# Patient Record
Sex: Female | Born: 1970 | Race: White | Hispanic: Yes | Marital: Married | Smoking: Never smoker
Health system: Southern US, Community
[De-identification: ages and names within clinical notes are randomized; demographics above are authoritative.]

## PROBLEM LIST (undated history)

## (undated) ENCOUNTER — Inpatient Hospital Stay (HOSPITAL_COMMUNITY): Payer: Self-pay

## (undated) DIAGNOSIS — I8393 Asymptomatic varicose veins of bilateral lower extremities: Secondary | ICD-10-CM

## (undated) DIAGNOSIS — I10 Essential (primary) hypertension: Secondary | ICD-10-CM

## (undated) DIAGNOSIS — K8000 Calculus of gallbladder with acute cholecystitis without obstruction: Secondary | ICD-10-CM

## (undated) HISTORY — PX: ANKLE SURGERY: SHX546

## (undated) HISTORY — PX: ENDOSCOPIC VEIN LASER TREATMENT: SHX1508

## (undated) HISTORY — PX: DILATION AND CURETTAGE OF UTERUS: SHX78

---

## 1998-02-15 ENCOUNTER — Emergency Department (HOSPITAL_COMMUNITY): Admission: EM | Admit: 1998-02-15 | Discharge: 1998-02-15 | Payer: Self-pay | Admitting: Emergency Medicine

## 1998-02-15 ENCOUNTER — Encounter: Payer: Self-pay | Admitting: Emergency Medicine

## 1998-03-18 ENCOUNTER — Ambulatory Visit (HOSPITAL_COMMUNITY): Admission: RE | Admit: 1998-03-18 | Discharge: 1998-03-18 | Payer: Self-pay | Admitting: Obstetrics

## 1998-07-30 ENCOUNTER — Inpatient Hospital Stay (HOSPITAL_COMMUNITY): Admission: AD | Admit: 1998-07-30 | Discharge: 1998-08-02 | Payer: Self-pay | Admitting: *Deleted

## 1998-08-12 ENCOUNTER — Emergency Department (HOSPITAL_COMMUNITY): Admission: EM | Admit: 1998-08-12 | Discharge: 1998-08-12 | Payer: Self-pay | Admitting: Emergency Medicine

## 1998-08-12 ENCOUNTER — Encounter: Payer: Self-pay | Admitting: Emergency Medicine

## 1998-08-18 ENCOUNTER — Emergency Department (HOSPITAL_COMMUNITY): Admission: EM | Admit: 1998-08-18 | Discharge: 1998-08-18 | Payer: Self-pay | Admitting: *Deleted

## 2001-05-27 ENCOUNTER — Encounter: Payer: Self-pay | Admitting: Family Medicine

## 2001-05-27 ENCOUNTER — Ambulatory Visit (HOSPITAL_COMMUNITY): Admission: RE | Admit: 2001-05-27 | Discharge: 2001-05-27 | Payer: Self-pay | Admitting: Family Medicine

## 2003-04-25 ENCOUNTER — Ambulatory Visit: Admission: RE | Admit: 2003-04-25 | Discharge: 2003-04-25 | Payer: Self-pay | Admitting: Obstetrics and Gynecology

## 2003-04-26 ENCOUNTER — Ambulatory Visit (HOSPITAL_COMMUNITY): Admission: RE | Admit: 2003-04-26 | Discharge: 2003-04-26 | Payer: Self-pay | Admitting: *Deleted

## 2003-06-25 ENCOUNTER — Inpatient Hospital Stay (HOSPITAL_COMMUNITY): Admission: AD | Admit: 2003-06-25 | Discharge: 2003-06-27 | Payer: Self-pay | Admitting: *Deleted

## 2004-04-09 ENCOUNTER — Ambulatory Visit: Payer: Self-pay | Admitting: Nurse Practitioner

## 2004-04-22 ENCOUNTER — Ambulatory Visit: Payer: Self-pay | Admitting: Nurse Practitioner

## 2004-04-22 ENCOUNTER — Ambulatory Visit: Payer: Self-pay | Admitting: *Deleted

## 2004-06-20 ENCOUNTER — Ambulatory Visit: Payer: Self-pay | Admitting: Nurse Practitioner

## 2004-07-22 ENCOUNTER — Ambulatory Visit: Payer: Self-pay | Admitting: Nurse Practitioner

## 2005-03-23 ENCOUNTER — Ambulatory Visit: Payer: Self-pay | Admitting: Nurse Practitioner

## 2006-06-24 ENCOUNTER — Ambulatory Visit: Payer: Self-pay | Admitting: Internal Medicine

## 2006-06-24 ENCOUNTER — Emergency Department (HOSPITAL_COMMUNITY): Admission: EM | Admit: 2006-06-24 | Discharge: 2006-06-24 | Payer: Self-pay | Admitting: Emergency Medicine

## 2006-06-24 ENCOUNTER — Ambulatory Visit (HOSPITAL_COMMUNITY): Admission: RE | Admit: 2006-06-24 | Discharge: 2006-06-24 | Payer: Self-pay | Admitting: Nurse Practitioner

## 2006-06-29 ENCOUNTER — Ambulatory Visit (HOSPITAL_COMMUNITY): Admission: RE | Admit: 2006-06-29 | Discharge: 2006-06-29 | Payer: Self-pay | Admitting: Orthopedic Surgery

## 2007-01-19 ENCOUNTER — Encounter (INDEPENDENT_AMBULATORY_CARE_PROVIDER_SITE_OTHER): Payer: Self-pay | Admitting: *Deleted

## 2007-11-09 ENCOUNTER — Encounter (INDEPENDENT_AMBULATORY_CARE_PROVIDER_SITE_OTHER): Payer: Self-pay | Admitting: Internal Medicine

## 2007-11-09 LAB — CONVERTED CEMR LAB
ALT: 15 units/L (ref 0–35)
AST: 23 units/L (ref 0–37)
BUN: 9 mg/dL (ref 6–23)
CO2: 23 meq/L (ref 19–32)
Calcium: 9.6 mg/dL (ref 8.4–10.5)
Chloride: 107 meq/L (ref 96–112)
Creatinine, Ser: 0.54 mg/dL (ref 0.40–1.20)
Glucose, Bld: 91 mg/dL (ref 70–99)
HDL: 57 mg/dL (ref >39)
TSH: 1.168 microintl units/mL (ref 0.350–4.50)
Total Bilirubin: 0.6 mg/dL (ref 0.3–1.2)
Total CHOL/HDL Ratio: 3.5
Triglycerides: 142 mg/dL (ref <150)

## 2008-08-22 ENCOUNTER — Ambulatory Visit: Payer: Self-pay | Admitting: Internal Medicine

## 2008-09-06 ENCOUNTER — Ambulatory Visit: Payer: Self-pay | Admitting: Internal Medicine

## 2009-02-19 ENCOUNTER — Ambulatory Visit: Payer: Self-pay | Admitting: Internal Medicine

## 2009-10-04 ENCOUNTER — Ambulatory Visit: Payer: Self-pay | Admitting: Family Medicine

## 2009-10-08 ENCOUNTER — Ambulatory Visit: Payer: Self-pay | Admitting: Internal Medicine

## 2009-11-29 ENCOUNTER — Ambulatory Visit: Payer: Self-pay | Admitting: Internal Medicine

## 2010-01-07 ENCOUNTER — Ambulatory Visit: Payer: Self-pay | Admitting: Internal Medicine

## 2010-01-07 LAB — CONVERTED CEMR LAB
Chlamydia, DNA Probe: NEGATIVE
Pap Smear: NEGATIVE

## 2010-06-13 ENCOUNTER — Inpatient Hospital Stay (INDEPENDENT_AMBULATORY_CARE_PROVIDER_SITE_OTHER)
Admission: RE | Admit: 2010-06-13 | Discharge: 2010-06-13 | Disposition: A | Discharge status: Home / Self Care | Payer: Self-pay | Source: Ambulatory Visit | Attending: Family Medicine | Admitting: Family Medicine

## 2010-06-13 DIAGNOSIS — I8390 Asymptomatic varicose veins of unspecified lower extremity: Secondary | ICD-10-CM

## 2010-09-19 NOTE — Op Note (Signed)
[name], Weiss            ACCOUNT NO.:  [id number]   MEDICAL RECORD NO.:  [id number]          PATIENT TYPE:  AMB   LOCATION:  DAY                          FACILITY:  [facility]   PHYSICIAN:  Vania Rea. Supple, M.D.  DATE OF BIRTH:  1971-03-12   DATE OF PROCEDURE:  06/29/2006  DATE OF DISCHARGE:                               OPERATIVE REPORT   PREOPERATIVE DIAGNOSIS:  Displaced left bimalleolar ankle fracture.   POSTOPERATIVE DIAGNOSIS:  Displaced left bimalleolar ankle fracture.   PROCEDURE:  Open reduction/internal fixation of displaced left  bimalleolar ankle fracture.   SURGEON:  Vania Rea. Supple, M.D.   Threasa HeadsFrench Ana A. Shuford, P.A.-C.   ANESTHESIA:  General endotracheal.  In addition, at the end of the case,  we applied local anesthetic, 0.5% Marcaine with epinephrine along the  incision edges.   TOURNIQUET TIME:  Less than two hours.   ESTIMATED BLOOD LOSS:  100 cc.   DRAINS:  None.   HISTORY:  [name] is a 40 year old female who approximately eight  days ago slipped and fell, injuring her left ankle.  She sought medical  help at a local emergency room approximately four days after the  original injury, where x-rays were obtained.  She had a bimalleolar  ankle fracture.  She was placed in a splint and was followed up in my  office yesterday, at which time on evaluation, she was found to have the  displaced left ankle fracture but well padded and splinted.  Radiographs  were reviewed.  Due to the degree of displacement, she is brought to the  operating room at this time for planned left ankle ORIF, as described  below.   I preoperatively counseled [name] and her husband on treatment  options as well as risks versus the benefits thereof.  Possible surgical  complications of bleeding, infection, neurovascular injury, malunion,  nonunion, post-traumatic arthritis, ongoing pain and swelling, and  possible need for additional surgery were reviewed.   They understand and  accept and agree with our planned procedure.   PROCEDURE IN DETAIL:  After undergoing routine preoperative evaluation,  the patient received prophylactic antibiotics.  Placed supine on the  operating table and underwent smooth induction of general endotracheal  anesthesia.  Did receive prophylactic antibiotics.  The tourniquet was  applied to the left thigh, and the left leg was then sterilely prepped  and draped in a standard fashion.  The leg was exsanguinated with the  tourniquet inflated to 350 mmHg.  Began with an anterior hockey stick  incision about the medial malleolus with skin flaps elevated and  incision length totally approximately 6 cm.  Dissection carried deeply  where the saphenous vein was carefully identified and protected.  Dissection deeply to the fracture site, which was exposed with  subperiosteal elevation.  Interpose soft tissue was removed from the  fracture site as well as soft tissue and clot within the ankle joint.  Inspected the talar dome, which showed no obvious defects on the area  that was visible with this limited view.  We copiously irrigated the  ankle joint.  Attention was  then turned laterally, where a longitudinal  12 cm incision was made over the distal fibula.  Sharp dissection  carried down to the skin and subcutaneous tissues, and deep fascia  divided in line with the skin incision with the peroneal musculature and  tendons reflected posteriorly and subperiosteal elevation used to expose  the lateral and posterior cortex of the distal fibula, centered about  the fracture site, and proximally and distally from this area.  The  fracture was then exposed, and interpose soft tissue and blood clot was  meticulously removed.  Reduction was then achieved utilizing a bone-  holding clamp, then we contoured a seven-hole 1/3 tibial plate to fit  over the posterolateral cortex of the distal fibula.  This was then  transfixed in a  standard AO technique with 3.5 cortical screws  proximally, 4.0 cancellous screws distally, and a lag screw across the  fracture site to allow compression.  Fluoroscopic images were then  obtained, which confirmed good alignment of the fracture site and good  position of the hardware.  Our attention was then directed laterally,  where under direct visualization, the medial malleolus was directly  reduced, and then two guidepin's for the 4.0 cannulated screws were then  passed across the medial malleolar fracture site.  Fluoroscopic images  were used to confirm proper positioning of the hardware.  We then over-  drilled these and placed two 44 mm 4.0 cancellous screws with washers,  obtaining good bony purchase.  Excellent compression across the fracture  site was achieved, and then fluoroscopic images were then used to  confirm good alignment of the fracture site and good position of the  hardware.  The wounds were all then copiously irrigated and closed in  layers with 0 Vicryl over the deep fascia, laterally 2-0 Vicryl to the  subcu medially and laterally, and intracuticular 3-0 Monocryl for the  skin laterally, and Steri-Strips applied over medial and lateral  incisions.  A combination of Marcaine with epinephrine was instilled  along the skin edges, both medially and laterally.  A bulky, well-  padded, short leg plaster splint was applied with the ankle in the  neutral position.  The tourniquet was then let down.  The patient was  extubated and taken to the recovery room in stable condition.      Vania Rea. Supple, M.D.  Electronically Signed     KMS/MEDQ  D:  06/29/2006  T:  06/29/2006  Job:  [id number]

## 2011-08-10 ENCOUNTER — Other Ambulatory Visit: Payer: Self-pay

## 2011-09-23 ENCOUNTER — Ambulatory Visit
Admission: RE | Admit: 2011-09-23 | Discharge: 2011-09-23 | Disposition: A | Discharge status: Home / Self Care | Payer: No Typology Code available for payment source | Source: Ambulatory Visit | Attending: Specialist | Admitting: Specialist

## 2011-09-23 ENCOUNTER — Other Ambulatory Visit: Payer: Self-pay | Admitting: Specialist

## 2011-09-23 DIAGNOSIS — R609 Edema, unspecified: Secondary | ICD-10-CM

## 2011-09-23 DIAGNOSIS — R52 Pain, unspecified: Secondary | ICD-10-CM

## 2011-10-21 ENCOUNTER — Encounter (HOSPITAL_COMMUNITY): Payer: Self-pay

## 2011-10-21 ENCOUNTER — Emergency Department (INDEPENDENT_AMBULATORY_CARE_PROVIDER_SITE_OTHER): Payer: Self-pay

## 2011-10-21 ENCOUNTER — Emergency Department (HOSPITAL_COMMUNITY)
Admission: EM | Admit: 2011-10-21 | Discharge: 2011-10-21 | Disposition: A | Discharge status: Home / Self Care | Payer: Self-pay | Source: Home / Self Care | Attending: Emergency Medicine | Admitting: Emergency Medicine

## 2011-10-21 DIAGNOSIS — L97309 Non-pressure chronic ulcer of unspecified ankle with unspecified severity: Secondary | ICD-10-CM

## 2011-10-21 HISTORY — DX: Essential (primary) hypertension: I10

## 2011-10-21 MED ORDER — TRAMADOL HCL 50 MG PO TABS: 50.0000 mg | ORAL_TABLET | Freq: Four times a day (QID) | ORAL | Status: AC | PRN

## 2011-10-21 MED ORDER — AMOXICILLIN-POT CLAVULANATE 500-125 MG PO TABS: 1.0000 | ORAL_TABLET | Freq: Two times a day (BID) | ORAL | Status: AC

## 2011-10-21 NOTE — ED Provider Notes (Addendum)
History     CSN: [id number]  Arrival date & time 10/21/11  1224   First MD Initiated Contact with Patient 10/21/11 1423      Chief Complaint  Patient presents with  . Ankle Pain    (Consider location/radiation/quality/duration/timing/severity/associated sxs/prior treatment) HPI Comments:   Patient persists urgent care this afternoon complaining of left ankle swelling and drainage that started yesterday. She describes that for about 2 weeks she bled some from the left lateral aspect of her left ankle and then she developed a small ulcer in that area where she had her surgical scar from an old ankle surgery repair about 5 years ago. She does not remember any recent injury or direct trauma to the area. Patient denies any numbness, tingling or weakness of her left foot. Patient also denies any constitutional symptoms such as changes in appetite, fevers, generalized malaise, Oddi aches.  Patient is a 41 y.o. female presenting with ankle pain. The history is provided by the patient.  Ankle Pain  The incident occurred at home. The pain is moderate. The pain has been constant since onset. Pertinent negatives include no numbness, no inability to bear weight, no loss of motion, no muscle weakness, no loss of sensation and no tingling. She reports no foreign bodies present. The symptoms are aggravated by activity, bearing weight and palpation. Treatments tried: GENTIAN OF VIOLET. The treatment provided no relief.    Past Medical History  Diagnosis Date  . Hypertension     Past Surgical History  Procedure Date  . Ankle surgery     No family history on file.  History  Substance Use Topics  . Smoking status: Never Smoker   . Smokeless tobacco: Not on file  . Alcohol Use: No    OB History    Grav Para Term Preterm Abortions TAB SAB Ect Mult Living                  Review of Systems  Constitutional: Negative for fever, chills, activity change, appetite change and fatigue.    Musculoskeletal: Positive for joint swelling. Negative for back pain and arthralgias.  Skin: Positive for wound.  Neurological: Negative for tingling, facial asymmetry, weakness and numbness.    Allergies  Review of patient's allergies indicates no known allergies.  Home Medications   Current Outpatient Rx  Name Route Sig Dispense Refill  . PRESCRIPTION MEDICATION  BP medication    . AMOXICILLIN-POT CLAVULANATE 500-125 MG PO TABS Oral Take 1 tablet (500 mg total) by mouth 2 (two) times daily. 20 tablet 0  . TRAMADOL HCL 50 MG PO TABS Oral Take 1 tablet (50 mg total) by mouth every 6 (six) hours as needed for pain. 15 tablet 0    BP 138/91  Pulse 72  Temp 98 F (36.7 C) (Oral)  Resp 20  SpO2 99%  LMP 10/15/2011  Physical Exam  Nursing note and vitals reviewed. Constitutional: She appears well-developed and well-nourished.  Non-toxic appearance. She does not have a sickly appearance. She does not appear ill. No distress.  Musculoskeletal: She exhibits tenderness. She exhibits no edema.       Feet:  Neurological: She is alert.  Skin: No erythema.    ED Course  Procedures (including critical care time)  Labs Reviewed - No data to display Dg Ankle Complete Left  10/21/2011  *RADIOLOGY REPORT*  Clinical Data: Left ankle swelling, bruising and lateral wound to drainage.  Open, black wound laterally.  Status post hardware fixation of the  ankle on 06/29/2006.  LEFT ANKLE COMPLETE - 3+ VIEW  Comparison: 06/29/2080 and 06/24/2006.  Findings: Screw fixation of the medial malleolus and screw and plate fixation of the lateral malleolus.  Diffuse soft tissue swelling.  Small area of gas/air in the superficial soft tissues, laterally.  No bone destruction or periosteal reaction.  No effusion seen.  Mild talotibial degenerative changes and mild posterior calcaneal spur formation.  IMPRESSION:  1.  Diffuse soft tissue swelling with a small left lateral ulcer. 2.  No plain radiographic  evidence of osteomyelitis.  Original Report Authenticated By: Darrol Angel, M.D.     1. Ulcer of ankle    Have decided to treat patient for cellulitis, or sure to start with antibiotic and followup next week with primary care Dr. Marta Antu the initial origin of this ulceration was possibly a superficial varices did ruptured and generated area discontinued epidermis and ulceration.    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 Had a small ulceration with associated soft tissue swelling and minimal discrete clear discharge from any ulcerative lesion on the lateral malleolus of her L ankle. Suspect patient has a localized infection with a mild soft tissue swelling. Does not feel particularly warmth and discharges a clear exudate, on exam was not revealing for are suggestive of subcutaneous crepitus to suggest air suspect imaging related to area where small ulcer (1.8 cm) 10 continuity with  environment. And understands to followup with her primary care Dr. if one not healing properly to be referred to the care Center. He understood treatment plan and symptoms that would warrant further evaluation in the emergency department.       Jimmie Molly, MD 10/21/11 1610  Jimmie Molly, MD 10/21/11 [phone]

## 2011-10-21 NOTE — ED Notes (Signed)
Pt c/o open area to lateral aspect of lt ankle for 2 weeks.  States it started draining white drainage today.  States she has been applying gentian violet to the area.  Pt had surgery with hardware placed approx. 5 years ago.  No new injury.

## 2011-10-21 NOTE — ED Notes (Signed)
Speaks Spanish- Nando EMT translating per pt request.

## 2011-10-21 NOTE — Discharge Instructions (Signed)
  Sus piernas demuestran evidencia- de algunas deficiencias venosas, con varices y telangiectasias- No utilize mas la violeta de genciana- tome este antibiotico, y vea a su doctor la semana proxima para asegurarnos que esta lesion empieze a curarse, o establecer si necesitara un referido para el wound center [employer] explique   lceras en la piel  (Skin Ulcer) Rolan Lipa en la piel es una llaga abierta que puede ser superficial o profunda. En algunos casos se infectan y son difciles de tratar. Puede pasar 1 mes o ms antes que se produzca un progreso en la curacin.  CAUSAS   Traumatismos.   Problemas en las venas o en las arterias.   Diabetes.   [place] de insectos.   Escaras.   Problemas inflamatorios.  SNTOMAS   Dolor, irritacin, hinchazn y sensibilidad alrededor de Engineer, site.   Grant Ruts.   Sangrado que proviene de la lcera.   Un liquido amarillo o claro que sale de Engineer, site.  DIAGNSTICO  Hay diferentes tipos de lceras de la piel. Teresita Madura herida abierta deber ser examinada. Para determinar el tipo de lcera podrn indicarle algunas pruebas. El tratamiento adecuado depende del tipo de Occupational hygienist.  TRATAMIENTO El tratamiento es un desafo a Air cabin crew. Puede incluir:   El uso de una venda Adult nurse, medias de compresin, o un molde de gel sobre el rea de la Occupational hygienist.   Tomar antibiticos o aplicar cremas antibiticas en la zona afectada, si hay infeccin.  INSTRUCCIONES PARA EL CUIDADO EN EL HOGAR   Colquese las curacionesvendajes, envolturas, o moldes segn lo indique su mdico.   Cambie el vendaje en la forma aconsejada por el mdico.   Tome todos los medicamentos segn le indic su mdico.   Mantenga la zona limpia y [place].   Evitar golpearse en la zona afectada.   Consuma una dieta balanceada y saludable que incluya muchas frutas y verduras.   Si fuma, considere dejar de fumar o reducir la cantidad de cigarrillos.   Una vez que la lcera sana, puede hacer  ejercicio con regularidad segn las indicaciones de su mdico.   Haga lo que le indica el mdico para asegurarse de que su presin arterial, el colesterol y la diabetes estn bien controladas.   Mantenga la piel humectada. La piel seca puede agrietarse y causar lceras.  SOLICITE ATENCIN MDICA DE INMEDIATO SI:  El dolor empeora.   Tiene hinchazn, enrojecimiento o lquido en la zona de la lcera.   Siente escalofros.   Tiene fiebre.  ASEGRESE DE QUE:   Comprende estas instrucciones.   Controlar su enfermedad.   Solicitar ayuda de inmediato si no mejora o si empeora.  Document Released: 04/20/2005 Document Revised: 04/09/2011 [facility] Patient Information 2012 [place], [place].

## 2012-06-21 ENCOUNTER — Observation Stay (HOSPITAL_COMMUNITY)
Admission: EM | Admit: 2012-06-21 | Discharge: 2012-06-23 | Disposition: A | Discharge status: Home / Self Care | Payer: Self-pay | Attending: Surgery | Admitting: Surgery

## 2012-06-21 ENCOUNTER — Encounter (HOSPITAL_COMMUNITY): Payer: Self-pay | Admitting: Emergency Medicine

## 2012-06-21 ENCOUNTER — Emergency Department (HOSPITAL_COMMUNITY): Payer: Self-pay

## 2012-06-21 DIAGNOSIS — K8000 Calculus of gallbladder with acute cholecystitis without obstruction: Secondary | ICD-10-CM

## 2012-06-21 DIAGNOSIS — K810 Acute cholecystitis: Secondary | ICD-10-CM

## 2012-06-21 DIAGNOSIS — I10 Essential (primary) hypertension: Secondary | ICD-10-CM | POA: Insufficient documentation

## 2012-06-21 DIAGNOSIS — N390 Urinary tract infection, site not specified: Secondary | ICD-10-CM

## 2012-06-21 DIAGNOSIS — R1011 Right upper quadrant pain: Secondary | ICD-10-CM

## 2012-06-21 DIAGNOSIS — K8010 Calculus of gallbladder with chronic cholecystitis without obstruction: Secondary | ICD-10-CM

## 2012-06-21 HISTORY — DX: Calculus of gallbladder with acute cholecystitis without obstruction: K80.00

## 2012-06-21 LAB — CBC WITH DIFFERENTIAL/PLATELET
HCT: 37.5 % (ref 36.0–46.0)
MCH: 27.8 pg (ref 26.0–34.0)
Monocytes Relative: 6 % (ref 3–12)
Neutro Abs: 7.3 10*3/uL (ref 1.7–7.7)
Neutrophils Relative %: 74 % (ref 43–77)
Platelets: 209 10*3/uL (ref 150–400)

## 2012-06-21 LAB — URINE MICROSCOPIC-ADD ON

## 2012-06-21 LAB — URINALYSIS, ROUTINE W REFLEX MICROSCOPIC
Bilirubin Urine: NEGATIVE
Hgb urine dipstick: NEGATIVE
Ketones, ur: 15 mg/dL — AB
Protein, ur: 30 mg/dL — AB
Specific Gravity, Urine: 1.035 — ABNORMAL HIGH (ref 1.005–1.030)

## 2012-06-21 LAB — COMPREHENSIVE METABOLIC PANEL
ALT: 55 U/L — ABNORMAL HIGH (ref 0–35)
AST: 39 U/L — ABNORMAL HIGH (ref 0–37)
Alkaline Phosphatase: 62 U/L (ref 39–117)
Calcium: 9.2 mg/dL (ref 8.4–10.5)
Glucose, Bld: 97 mg/dL (ref 70–99)
Potassium: 3.7 mEq/L (ref 3.5–5.1)
Sodium: 138 mEq/L (ref 135–145)
Total Protein: 7.9 g/dL (ref 6.0–8.3)

## 2012-06-21 MED ORDER — KCL IN DEXTROSE-NACL 20-5-0.9 MEQ/L-%-% IV SOLN
INTRAVENOUS | Status: DC
  Administered 2012-06-21 – 2012-06-23 (×4): via INTRAVENOUS
  Filled 2012-06-21 (×5): qty 1000

## 2012-06-21 MED ORDER — SODIUM CHLORIDE 0.9 % IV SOLN
Freq: Once | INTRAVENOUS | Status: AC
  Administered 2012-06-21: 19:00:00 via INTRAVENOUS

## 2012-06-21 MED ORDER — ENOXAPARIN SODIUM 40 MG/0.4ML ~~LOC~~ SOLN
40.0000 mg | SUBCUTANEOUS | Status: DC
  Administered 2012-06-23: 40 mg via SUBCUTANEOUS
  Filled 2012-06-21 (×2): qty 0.4

## 2012-06-21 MED ORDER — ONDANSETRON HCL 4 MG/2ML IJ SOLN: 4.0000 mg | Freq: Four times a day (QID) | INTRAMUSCULAR | Status: DC | PRN

## 2012-06-21 MED ORDER — CIPROFLOXACIN IN D5W 400 MG/200ML IV SOLN
400.0000 mg | Freq: Two times a day (BID) | INTRAVENOUS | Status: DC
  Administered 2012-06-21 – 2012-06-23 (×4): 400 mg via INTRAVENOUS
  Filled 2012-06-21 (×5): qty 200

## 2012-06-21 MED ORDER — ONDANSETRON HCL 4 MG/2ML IJ SOLN
4.0000 mg | Freq: Once | INTRAMUSCULAR | Status: AC
  Administered 2012-06-21: 4 mg via INTRAVENOUS
  Filled 2012-06-21: qty 2

## 2012-06-21 MED ORDER — HYDROMORPHONE HCL PF 1 MG/ML IJ SOLN
1.0000 mg | Freq: Once | INTRAMUSCULAR | Status: AC
  Administered 2012-06-21: 1 mg via INTRAVENOUS
  Filled 2012-06-21: qty 1

## 2012-06-21 MED ORDER — DEXTROSE 5 % IV SOLN
1.0000 g | Freq: Once | INTRAVENOUS | Status: AC
  Administered 2012-06-21: 1 g via INTRAVENOUS
  Filled 2012-06-21: qty 10

## 2012-06-21 MED ORDER — HYDROMORPHONE HCL PF 1 MG/ML IJ SOLN
1.0000 mg | INTRAMUSCULAR | Status: DC | PRN
  Administered 2012-06-21 – 2012-06-23 (×4): 1 mg via INTRAVENOUS
  Filled 2012-06-21 (×4): qty 1

## 2012-06-21 NOTE — ED Notes (Signed)
Floor called to give report, Dois Davenport RN states to be called back in minutes.

## 2012-06-21 NOTE — ED Notes (Signed)
Pt c/o upper abd pain radiating around to back, onset 2:00am.  Also c/o nausea with vomiting.

## 2012-06-21 NOTE — ED Provider Notes (Signed)
History     CSN: [id number]  Arrival date & time 06/21/12  1540   First MD Initiated Contact with Patient 06/21/12 1748      Chief Complaint  Patient presents with  . Abdominal Pain    (Consider location/radiation/quality/duration/timing/severity/associated sxs/prior treatment) HPI The patient presents with concerns of abdominal pain, nausea, vomiting, diaphoresis. She states that she has had similar episodes in the recent past, though nothing as severe or as prolonged as this episode. Today, approximately 14 hours prior to evaluation the patient developed right upper quadrant, epigastric pain.  Since that time there's been sharp pain in that area.  There is radiation to the right flank.  There is associated nausea, vomiting.  There is no diarrhea or vaginal bleeding/discharge.   The patient is also anorexic.  No history of abdominal surgery.  No relief with anything.  No clear exacerbating factor Past Medical History  Diagnosis Date  . Hypertension     Past Surgical History  Procedure Laterality Date  . Ankle surgery      No family history on file.  History  Substance Use Topics  . Smoking status: Never Smoker   . Smokeless tobacco: Not on file  . Alcohol Use: No    OB History   Grav Para Term Preterm Abortions TAB SAB Ect Mult Living                  Review of Systems  Constitutional:       Per HPI, otherwise negative  HENT:       Per HPI, otherwise negative  Respiratory:       Per HPI, otherwise negative  Cardiovascular:       Per HPI, otherwise negative  Gastrointestinal: Negative for vomiting.  Endocrine:       Negative aside from HPI  Genitourinary:       Neg aside from HPI   Musculoskeletal:       Per HPI, otherwise negative  Skin: Negative.   Neurological: Negative for syncope.    Allergies  Review of patient's allergies indicates no known allergies.  Home Medications   Current Outpatient Rx  Name  Route  Sig  Dispense  Refill  .  Aspirin Effervescent (ALKA-SELTZER PO)   Oral   Take 2 tablets by mouth daily as needed. For upset stomach         . hydrochlorothiazide (HYDRODIURIL) 25 MG tablet   Oral   Take 12.5 mg by mouth daily.          Marland Kitchen ibuprofen (ADVIL,MOTRIN) 200 MG tablet   Oral   Take 400 mg by mouth daily as needed for pain.           BP 141/84  Pulse 76  Temp(Src) 98.1 F (36.7 C) (Oral)  Resp 16  SpO2 97%  LMP 05/25/2012  Physical Exam  Nursing note and vitals reviewed. Constitutional: She is oriented to person, place, and time. She appears well-developed and well-nourished. No distress.  HENT:  Head: Normocephalic and atraumatic.  Eyes: Conjunctivae and EOM are normal.  Cardiovascular: Normal rate and regular rhythm.   Pulmonary/Chest: Effort normal and breath sounds normal. No stridor. No respiratory distress.  Abdominal: She exhibits no distension. There is tenderness in the right upper quadrant and epigastric area. There is guarding and CVA tenderness. There is no rigidity.  Musculoskeletal: She exhibits no edema.  Neurological: She is alert and oriented to person, place, and time. No cranial nerve deficit.  Skin: Skin is  warm and dry.  Psychiatric: She has a normal mood and affect.    ED Course  Procedures (including critical care time)  Labs Reviewed  URINALYSIS, ROUTINE W REFLEX MICROSCOPIC - Abnormal; Notable for the following:    Color, Urine AMBER (*)    APPearance CLOUDY (*)    Specific Gravity, Urine 1.035 (*)    Ketones, ur 15 (*)    Protein, ur 30 (*)    Nitrite POSITIVE (*)    Leukocytes, UA MODERATE (*)    All other components within normal limits  COMPREHENSIVE METABOLIC PANEL - Abnormal; Notable for the following:    Creatinine, Ser 0.39 (*)    AST 39 (*)    ALT 55 (*)    All other components within normal limits  URINE MICROSCOPIC-ADD ON - Abnormal; Notable for the following:    Squamous Epithelial / LPF MANY (*)    Bacteria, UA MANY (*)    All other  components within normal limits  URINE CULTURE  CBC WITH DIFFERENTIAL  POCT PREGNANCY, URINE   No results found.   No diagnosis found.  I reviewed the initial labs with the patient and her husband.  Though the presentation is most consistent, Fridays, given the patient's positive Murphy's sign, she will have ultrasound.  Patient seen and evaluated by Dr. Luisa Hart as well.  MDM  This previously well F now p/w RUQ and R flank pain.  W/U c/w both pyelo and cholecystitis, via [place] and labs.  Pain well controlled in ED, and the patient received initial ABX. Patient admitted for further E/M.        Gerhard Munch, MD 06/21/12 2209

## 2012-06-21 NOTE — ED Notes (Addendum)
Patient transported to US 

## 2012-06-21 NOTE — H&P (Signed)
Christina Weiss is an 42 y.o. female.   Chief Complaint: RUQ abdominal pain HPI: 1 day hx of N/V/RUQ abdominal pain severe and constant.  No flank pain.    Past Medical History  Diagnosis Date  . Hypertension     Past Surgical History  Procedure Laterality Date  . Ankle surgery      No family history on file. Social History:  reports that she has never smoked. She does not have any smokeless tobacco history on file. She reports that she does not drink alcohol or use illicit drugs.  Allergies: No Known Allergies   (Not in a hospital admission)  Results for orders placed during the hospital encounter of 06/21/12 (from the past 48 hour(s))  CBC WITH DIFFERENTIAL     Status: None   Collection Time    06/21/12  4:07 PM      Result Value Range   WBC 9.9  4.0 - 10.5 K/uL   RBC 4.49  3.87 - 5.11 MIL/uL   Hemoglobin 12.5  12.0 - 15.0 g/dL   HCT 40.9  81.1 - 91.4 %   MCV 83.5  78.0 - 100.0 fL   MCH 27.8  26.0 - 34.0 pg   MCHC 33.3  30.0 - 36.0 g/dL   RDW 78.2  95.6 - 21.3 %   Platelets 209  150 - 400 K/uL   Neutrophils Relative 74  43 - 77 %   Neutro Abs 7.3  1.7 - 7.7 K/uL   Lymphocytes Relative 19  12 - 46 %   Lymphs Abs 1.8  0.7 - 4.0 K/uL   Monocytes Relative 6  3 - 12 %   Monocytes Absolute 0.5  0.1 - 1.0 K/uL   Eosinophils Relative 2  0 - 5 %   Eosinophils Absolute 0.2  0.0 - 0.7 K/uL   Basophils Relative 0  0 - 1 %   Basophils Absolute 0.0  0.0 - 0.1 K/uL  COMPREHENSIVE METABOLIC PANEL     Status: Abnormal   Collection Time    06/21/12  4:07 PM      Result Value Range   Sodium 138  135 - 145 mEq/L   Potassium 3.7  3.5 - 5.1 mEq/L   Chloride 99  96 - 112 mEq/L   CO2 28  19 - 32 mEq/L   Glucose, Bld 97  70 - 99 mg/dL   BUN 8  6 - 23 mg/dL   Creatinine, Ser 0.86 (*) 0.50 - 1.10 mg/dL   Calcium 9.2  8.4 - 57.8 mg/dL   Total Protein 7.9  6.0 - 8.3 g/dL   Albumin 4.2  3.5 - 5.2 g/dL   AST 39 (*) 0 - 37 U/L   ALT 55 (*) 0 - 35 U/L   Alkaline Phosphatase 62  39 -  117 U/L   Total Bilirubin 0.4  0.3 - 1.2 mg/dL   GFR calc non Af Amer >90  >90 mL/min   GFR calc Af Amer >90  >90 mL/min   Comment:            The eGFR has been calculated     using the CKD EPI equation.     This calculation has not been     validated in all clinical     situations.     eGFR's persistently     <90 mL/min signify     possible Chronic Kidney Disease.  URINALYSIS, ROUTINE W REFLEX MICROSCOPIC     Status:  Abnormal   Collection Time    06/21/12  4:19 PM      Result Value Range   Color, Urine AMBER (*) YELLOW   Comment: BIOCHEMICALS MAY BE AFFECTED BY COLOR   APPearance CLOUDY (*) CLEAR   Specific Gravity, Urine 1.035 (*) 1.005 - 1.030   pH 6.5  5.0 - 8.0   Glucose, UA NEGATIVE  NEGATIVE mg/dL   Hgb urine dipstick NEGATIVE  NEGATIVE   Bilirubin Urine NEGATIVE  NEGATIVE   Ketones, ur 15 (*) NEGATIVE mg/dL   Protein, ur 30 (*) NEGATIVE mg/dL   Urobilinogen, UA 0.2  0.0 - 1.0 mg/dL   Nitrite POSITIVE (*) NEGATIVE   Leukocytes, UA MODERATE (*) NEGATIVE  URINE MICROSCOPIC-ADD ON     Status: Abnormal   Collection Time    06/21/12  4:19 PM      Result Value Range   Squamous Epithelial / LPF MANY (*) RARE   WBC, UA 7-10  <3 WBC/hpf   RBC / HPF 0-2  <3 RBC/hpf   Bacteria, UA MANY (*) RARE   Urine-Other MUCOUS PRESENT    POCT PREGNANCY, URINE     Status: None   Collection Time    06/21/12  4:25 PM      Result Value Range   Preg Test, Ur NEGATIVE  NEGATIVE   Comment:            THE SENSITIVITY OF THIS     METHODOLOGY IS >24 mIU/mL   US Abdomen Complete  06/21/2012  *RADIOLOGY REPORT*  Clinical Data:  Right upper quadrant pain  COMPLETE ABDOMINAL ULTRASOUND  Comparison:  None.  Findings:  Gallbladder:  Multiple mobile echogenic foci with posterior acoustic shadowing in the gallbladder consistent with cholelithiasis.  The gallbladder wall is thickened to 9 mm and there is some reactive hypoechoic change in the adjacent hepatic surface.  Per the sonographer, the  sonographic Murphy's sign was positive.  Common bile duct:  Prominence of the common bile duct which measures up to 13 mm in the porta hepatis and 8 mm more distally toward the pancreatic head.  No definite choledocholithiasis identified.  Liver:  No focal lesion.  Hepatic parenchyma is diffusely echogenic and coarsened consistent with steatosis.  IVC:  Appears normal.  Pancreas:  Limited evaluation secondary to obscuring bowel gas.  Spleen:  Sonographically unremarkable.  11.2 cm in length.  Right Kidney:  Sonographically unremarkable.  No hydronephrosis, nephrolithiasis or solid lesion.  12.8 cm in length.  Left Kidney:  Sonographically unremarkable.  No hydronephrosis, nephrolithiasis or solid lesion.  13.1 cm in length.  Abdominal aorta:  No aneurysm identified.  IMPRESSION:  1.  Sonographic findings are consistent with acute cholecystitis.  2.  Proximally enlarged common bile duct without visualized choledocholithiasis.  3.  Hepatic steatosis Negative abdominal ultrasound.   Original Report Authenticated By: Malachy Moan, M.D.     Review of Systems  Constitutional: Negative for fever and chills.  HENT: Negative.   Eyes: Negative.   Respiratory: Negative.   Cardiovascular: Negative.   Gastrointestinal: Positive for nausea, vomiting and abdominal pain.  Genitourinary: Negative.  Negative for dysuria and flank pain.  Musculoskeletal: Negative.   Skin: Negative.   Neurological: Negative.   Psychiatric/Behavioral: Negative.     Blood pressure 141/84, pulse 76, temperature 98.1 F (36.7 C), temperature source Oral, resp. rate 16, last menstrual period 05/25/2012, SpO2 97.00%. Physical Exam  Constitutional: She is oriented to person, place, and time. She appears well-developed and well-nourished.  HENT:  Head: Atraumatic.  Eyes: No scleral icterus.  Neck: Normal range of motion. Neck supple.  Cardiovascular: Normal rate and regular rhythm.   Respiratory: Effort normal and breath sounds  normal.  GI: There is tenderness in the right upper quadrant. There is positive Murphy's sign. There is no CVA tenderness.  Musculoskeletal: Normal range of motion.  Neurological: She is alert and oriented to person, place, and time.  Skin: Skin is warm and dry.  Psychiatric: She has a normal mood and affect. Her behavior is normal. Thought content normal.     Assessment/Plan Acute cholecystitis Mild LFT elevation Pyuria of unknown significance Admit/ IVF/ABX/lap chole  Discussed with husband and Dr Jeraldine Loots who translated.  Nature Kueker A. 06/21/2012, 9:51 PM

## 2012-06-22 ENCOUNTER — Encounter (HOSPITAL_COMMUNITY)
Admission: EM | Disposition: A | Discharge status: Home / Self Care | Payer: Self-pay | Source: Home / Self Care | Attending: Emergency Medicine

## 2012-06-22 ENCOUNTER — Observation Stay (HOSPITAL_COMMUNITY): Payer: Self-pay | Admitting: Anesthesiology

## 2012-06-22 ENCOUNTER — Encounter (HOSPITAL_COMMUNITY): Payer: Self-pay | Admitting: Anesthesiology

## 2012-06-22 ENCOUNTER — Encounter (HOSPITAL_COMMUNITY): Payer: Self-pay | Admitting: *Deleted

## 2012-06-22 HISTORY — PX: CHOLECYSTECTOMY: SHX55

## 2012-06-22 LAB — CBC
MCV: 84 fL (ref 78.0–100.0)
Platelets: 160 10*3/uL (ref 150–400)
RBC: 4 MIL/uL (ref 3.87–5.11)
RDW: 14.2 % (ref 11.5–15.5)
WBC: 6.5 10*3/uL (ref 4.0–10.5)

## 2012-06-22 LAB — COMPREHENSIVE METABOLIC PANEL
Albumin: 3.4 g/dL — ABNORMAL LOW (ref 3.5–5.2)
Alkaline Phosphatase: 90 U/L (ref 39–117)
BUN: 7 mg/dL (ref 6–23)
Chloride: 103 mEq/L (ref 96–112)
Creatinine, Ser: 0.42 mg/dL — ABNORMAL LOW (ref 0.50–1.10)
GFR calc Af Amer: >90 mL/min (ref >90)
Glucose, Bld: 131 mg/dL — ABNORMAL HIGH (ref 70–99)
Total Bilirubin: 0.7 mg/dL (ref 0.3–1.2)
Total Protein: 6.7 g/dL (ref 6.0–8.3)

## 2012-06-22 LAB — SURGICAL PCR SCREEN: Staphylococcus aureus: NEGATIVE

## 2012-06-22 SURGERY — LAPAROSCOPIC CHOLECYSTECTOMY
Anesthesia: General | Site: Abdomen | Wound class: Contaminated

## 2012-06-22 MED ORDER — SUCCINYLCHOLINE CHLORIDE 20 MG/ML IJ SOLN
INTRAMUSCULAR | Status: DC | PRN
  Administered 2012-06-22: 100 mg via INTRAVENOUS

## 2012-06-22 MED ORDER — ROCURONIUM BROMIDE 100 MG/10ML IV SOLN
INTRAVENOUS | Status: DC | PRN
  Administered 2012-06-22: 10 mg via INTRAVENOUS
  Administered 2012-06-22: 20 mg via INTRAVENOUS

## 2012-06-22 MED ORDER — SODIUM CHLORIDE 0.9 % IR SOLN
Status: DC | PRN
  Administered 2012-06-22: 1000 mL

## 2012-06-22 MED ORDER — LIDOCAINE HCL (CARDIAC) 20 MG/ML IV SOLN
INTRAVENOUS | Status: DC | PRN
  Administered 2012-06-22: 70 mg via INTRAVENOUS

## 2012-06-22 MED ORDER — CEFAZOLIN SODIUM-DEXTROSE 2-3 GM-% IV SOLR
2.0000 g | INTRAVENOUS | Status: AC
  Administered 2012-06-22: 2 g via INTRAVENOUS
  Filled 2012-06-22: qty 50

## 2012-06-22 MED ORDER — SODIUM CHLORIDE 0.9 % IV SOLN
INTRAVENOUS | Status: DC | PRN
  Administered 2012-06-22: 12:00:00

## 2012-06-22 MED ORDER — MORPHINE SULFATE 4 MG/ML IJ SOLN
4.0000 mg | INTRAMUSCULAR | Status: DC | PRN
  Administered 2012-06-22: 4 mg via INTRAVENOUS
  Filled 2012-06-22: qty 1

## 2012-06-22 MED ORDER — NEOSTIGMINE METHYLSULFATE 1 MG/ML IJ SOLN
INTRAMUSCULAR | Status: DC | PRN
  Administered 2012-06-22: 3 mg via INTRAVENOUS

## 2012-06-22 MED ORDER — MIDAZOLAM HCL 5 MG/5ML IJ SOLN
INTRAMUSCULAR | Status: DC | PRN
  Administered 2012-06-22: 2 mg via INTRAVENOUS

## 2012-06-22 MED ORDER — GLYCOPYRROLATE 0.2 MG/ML IJ SOLN
INTRAMUSCULAR | Status: DC | PRN
  Administered 2012-06-22: .4 mg via INTRAVENOUS

## 2012-06-22 MED ORDER — OXYCODONE-ACETAMINOPHEN 5-325 MG PO TABS
1.0000 | ORAL_TABLET | ORAL | Status: DC | PRN
  Administered 2012-06-23: 2 via ORAL
  Filled 2012-06-22: qty 2

## 2012-06-22 MED ORDER — OXYCODONE HCL 5 MG/5ML PO SOLN: 5.0000 mg | Freq: Once | ORAL | Status: AC | PRN

## 2012-06-22 MED ORDER — PROPOFOL 10 MG/ML IV BOLUS
INTRAVENOUS | Status: DC | PRN
  Administered 2012-06-22: 180 mg via INTRAVENOUS

## 2012-06-22 MED ORDER — OXYCODONE HCL 5 MG PO TABS: 5.0000 mg | ORAL_TABLET | Freq: Once | ORAL | Status: AC | PRN

## 2012-06-22 MED ORDER — BUPIVACAINE-EPINEPHRINE 0.25% -1:200000 IJ SOLN
INTRAMUSCULAR | Status: DC | PRN
  Administered 2012-06-22: 20 mL

## 2012-06-22 MED ORDER — WHITE PETROLATUM GEL
Status: AC
  Filled 2012-06-22: qty 5

## 2012-06-22 MED ORDER — HYDROMORPHONE HCL PF 1 MG/ML IJ SOLN
0.2500 mg | INTRAMUSCULAR | Status: DC | PRN
  Administered 2012-06-22 (×2): 0.5 mg via INTRAVENOUS

## 2012-06-22 MED ORDER — SUCCINYLCHOLINE CHLORIDE 20 MG/ML IJ SOLN: INTRAMUSCULAR | Status: DC | PRN

## 2012-06-22 MED ORDER — LACTATED RINGERS IV SOLN
INTRAVENOUS | Status: DC | PRN
  Administered 2012-06-22 (×2): via INTRAVENOUS

## 2012-06-22 MED ORDER — ONDANSETRON HCL 4 MG/2ML IJ SOLN
INTRAMUSCULAR | Status: DC | PRN
  Administered 2012-06-22: 4 mg via INTRAVENOUS

## 2012-06-22 MED ORDER — KETOROLAC TROMETHAMINE 30 MG/ML IJ SOLN
INTRAMUSCULAR | Status: DC | PRN
  Administered 2012-06-22: 30 mg via INTRAVENOUS

## 2012-06-22 MED ORDER — ONDANSETRON HCL 4 MG/2ML IJ SOLN: 4.0000 mg | Freq: Four times a day (QID) | INTRAMUSCULAR | Status: DC | PRN

## 2012-06-22 MED ORDER — FENTANYL CITRATE 0.05 MG/ML IJ SOLN
INTRAMUSCULAR | Status: DC | PRN
  Administered 2012-06-22 (×4): 50 ug via INTRAVENOUS
  Administered 2012-06-22: 100 ug via INTRAVENOUS

## 2012-06-22 SURGICAL SUPPLY — 44 items
APL SKNCLS STERI-STRIP NONHPOA (GAUZE/BANDAGES/DRESSINGS) ×2
APPLIER CLIP 5 13 M/L LIGAMAX5 (MISCELLANEOUS) ×3
APR CLP MED LRG 5 ANG JAW (MISCELLANEOUS) ×2
BAG SPEC RTRVL LRG 6X4 10 (ENDOMECHANICALS) ×2
BANDAGE ADHESIVE 1X3 (GAUZE/BANDAGES/DRESSINGS) ×12 IMPLANT
BENZOIN TINCTURE PRP APPL 2/3 (GAUZE/BANDAGES/DRESSINGS) ×3 IMPLANT
CANISTER SUCTION 2500CC (MISCELLANEOUS) ×3 IMPLANT
CHLORAPREP W/TINT 26ML (MISCELLANEOUS) ×3 IMPLANT
CLIP APPLIE 5 13 M/L LIGAMAX5 (MISCELLANEOUS) ×2 IMPLANT
CLOTH BEACON ORANGE TIMEOUT ST (SAFETY) ×3 IMPLANT
COVER MAYO STAND STRL (DRAPES) ×2 IMPLANT
COVER SURGICAL LIGHT HANDLE (MISCELLANEOUS) ×3 IMPLANT
DECANTER SPIKE VIAL GLASS SM (MISCELLANEOUS) ×3 IMPLANT
DRAPE C-ARM 42X72 X-RAY (DRAPES) ×2 IMPLANT
ELECT REM PT RETURN 9FT ADLT (ELECTROSURGICAL) ×3
ELECTRODE REM PT RTRN 9FT ADLT (ELECTROSURGICAL) ×2 IMPLANT
ENDOLOOP SUT PDS II  0 18 (SUTURE) ×5
ENDOLOOP SUT PDS II 0 18 (SUTURE) ×5 IMPLANT
GLOVE BIO SURGEON STRL SZ 6.5 (GLOVE) IMPLANT
GLOVE BIO SURGEON STRL SZ7.5 (GLOVE) ×2 IMPLANT
GLOVE BIOGEL PI IND STRL 7.0 (GLOVE) ×1 IMPLANT
GLOVE BIOGEL PI IND STRL 7.5 (GLOVE) ×1 IMPLANT
GLOVE BIOGEL PI INDICATOR 7.0 (GLOVE) ×1
GLOVE BIOGEL PI INDICATOR 7.5 (GLOVE) ×1
GLOVE SURG SIGNA 7.5 PF LTX (GLOVE) ×3 IMPLANT
GLOVE SURG SS PI 7.0 STRL IVOR (GLOVE) ×4 IMPLANT
GOWN PREVENTION PLUS XLARGE (GOWN DISPOSABLE) ×3 IMPLANT
GOWN STRL NON-REIN LRG LVL3 (GOWN DISPOSABLE) ×7 IMPLANT
KIT BASIN OR (CUSTOM PROCEDURE TRAY) ×3 IMPLANT
KIT ROOM TURNOVER OR (KITS) ×3 IMPLANT
NS IRRIG 1000ML POUR BTL (IV SOLUTION) ×3 IMPLANT
PAD ARMBOARD 7.5X6 YLW CONV (MISCELLANEOUS) ×3 IMPLANT
POUCH SPECIMEN RETRIEVAL 10MM (ENDOMECHANICALS) ×2 IMPLANT
SCISSORS LAP 5X35 DISP (ENDOMECHANICALS) ×2 IMPLANT
SET CHOLANGIOGRAPH 5 50 .035 (SET/KITS/TRAYS/PACK) ×2 IMPLANT
SET IRRIG TUBING LAPAROSCOPIC (IRRIGATION / IRRIGATOR) ×3 IMPLANT
SLEEVE ENDOPATH XCEL 5M (ENDOMECHANICALS) ×6 IMPLANT
SPECIMEN JAR SMALL (MISCELLANEOUS) ×3 IMPLANT
SUT MON AB 4-0 PC3 18 (SUTURE) ×3 IMPLANT
TOWEL OR 17X24 6PK STRL BLUE (TOWEL DISPOSABLE) ×3 IMPLANT
TOWEL OR 17X26 10 PK STRL BLUE (TOWEL DISPOSABLE) ×3 IMPLANT
TRAY LAPAROSCOPIC (CUSTOM PROCEDURE TRAY) ×3 IMPLANT
TROCAR XCEL BLUNT TIP 100MML (ENDOMECHANICALS) ×3 IMPLANT
TROCAR XCEL NON-BLD 5MMX100MML (ENDOMECHANICALS) ×3 IMPLANT

## 2012-06-22 NOTE — Preoperative (Signed)
Beta Blockers   Reason not to administer Beta Blockers:Not Applicable 

## 2012-06-22 NOTE — Progress Notes (Signed)
UR completed 

## 2012-06-22 NOTE — Progress Notes (Signed)
Patient ID: Christina Weiss, female   DOB: [birth year], 42 y.o.   MRN: [id number] Still with RUQ abdominal pain  Tender in RUQ on exam  A/P:  Cholecystitis  Plan lap chole and possible cholangiogram today.  Through interpreter, I explained the risks which include, but are not limited to bleeding, infection, bile duct injury/leak, need to convert to an open procedure, etc.  She understands and agrees to proceed.  Likelihood of success is good.

## 2012-06-22 NOTE — Transfer of Care (Signed)
Immediate Anesthesia Transfer of Care Note  Patient: Christina Weiss  Procedure(s) Performed: Procedure(s): LAPAROSCOPIC CHOLECYSTECTOMY WITH ATTEMPTED CHOLANGIOGRAM (N/A)  Patient Location: PACU  Anesthesia Type:General  Level of Consciousness: awake, alert  and oriented  Airway & Oxygen Therapy: Patient Spontanous Breathing and Patient connected to nasal cannula oxygen  Post-op Assessment: Report given to PACU RN and Post -op Vital signs reviewed and stable  Post vital signs: Reviewed and stable  Complications: No apparent anesthesia complications

## 2012-06-22 NOTE — Op Note (Signed)
Christina Weiss, Christina Weiss            ACCOUNT NO.:  [id number]  MEDICAL RECORD NO.:  [id number]  LOCATION:  6N16C                        FACILITY:  [facility]  PHYSICIAN:  Abigail Miyamoto, M.D. DATE OF BIRTH:  07/01/1970  DATE OF PROCEDURE:  06/22/2012 DATE OF DISCHARGE:                              OPERATIVE REPORT   PREOPERATIVE DIAGNOSIS:  Acute cholecystitis with cholelithiasis.  POSTOPERATIVE DIAGNOSIS:  Acute cholecystitis with cholelithiasis.  PROCEDURE:  Laparoscopic cholecystectomy.  SURGEON:  Abigail Miyamoto, M.D.  ANESTHESIA:  General and 0.5% Marcaine.  ESTIMATED BLOOD LOSS:  Minimal.  INDICATIONS:  This is a 42 year old female who presents with right upper quadrant abdominal pain, elevated white blood count, and findings consistent with acute cholecystitis on ultrasound.  Decision made to proceed to the operating room for lap chole and possible cholangiogram.  FINDINGS:  The patient was found to have acutely inflamed gallbladder. The cystic duct was quite distended with stones as well.  I was able to remove several of these stones and close the cystic duct with the Endo loops.  I tried to perform a cholangiogram, but could not get a seal around the cholangiocatheter to perform the contrast study.  PROCEDURE IN DETAIL:  The patient was brought to the operating room, identified as Christina Weiss.  She was placed supine on the operative table and general anesthesia was induced.  Her abdomen was then prepped and draped in the usual sterile fashion.  I made a small incision just below the umbilicus.  I carried this down to the fascia which was Endoloop with scalpel.  A hemostat was used to pass the peritoneal cavity under direct vision.  Next, a 0 Vicryl pursestring suture was placed around the fascial opening.  The Hasson port was placed through the opening unit, and insufflation of the abdomen was begun.  A 5 mm port was then placed.  Patient epigastrium and 2 more in  the right upper quadrant all under direct vision.  The gallbladder was found to be distended acutely inflamed.  I had to needle aspirate bile from the gallbladder in order to grasp it.  The bile was clear consistent with hydrops of the gallbladder.  I was unable to grasp the gallbladder, and retract the liver bed.  The cystic artery was slightly anterior and was clipped proximally distally and transected.  It was very difficult to dissect out the cystic duct.  The cystic duct itself was distended with stones.  I had to open up the cystic duct on the very distal extent adjacent to the gallbladder and milked several stones out.  I then attempted to 4 perform a cholangiogram.  I cannot get a seal around the cystic duct with clips, therefore, A cholangiogram had to be aborted.  I then completely transected the cystic and then closed with two separate Endoloop PDS sutures.  I then slowly dissected free the gallbladder from liver bed.  Once this was straightened, the liver bed was placed in endosac and removed the incision at the umbilicus.  The 0 Vicryl the umbilicus was tied in place closing the fascial defect.  Liver bed was again examined.  Examined hemostasis felt to be achieved with cautery. I then thoroughly irrigated the  abdomen with normal saline.  Again, hemostasis appeared to be achieved.  All ports were then removed under direct vision.  The abdomen was deflated.  All incisions were then anesthetized with Marcaine and closed with 4-0 Monocryl subcuticular sutures.  Steri-Strips and Band-Aids were then applied. The patient tolerated procedure the well.  All the counts were correct at the end of the procedure.  The patient was then extubated in operating room and taken in stable condition to recovery room.     Abigail Miyamoto, M.D.     DB/MEDQ  D:  06/22/2012  T:  06/22/2012  Job:  [id number]

## 2012-06-22 NOTE — OR Nursing (Signed)
Pre-procedure verification via interpreter

## 2012-06-22 NOTE — Op Note (Signed)
LAPAROSCOPIC CHOLECYSTECTOMY WITH ATTEMPTED CHOLANGIOGRAM  Procedure Note  Christina Weiss 06/21/2012 - 06/22/2012   Pre-op Diagnosis: CHOLECYSTITIS     Post-op Diagnosis: same  Procedure(s): LAPAROSCOPIC CHOLECYSTECTOMY WITH ATTEMPTED CHOLANGIOGRAM  Surgeon(s): Shelly Rubenstein, MD  Anesthesia: General  Staff:  Circulator: Pauletta Browns, RN Relief Circulator: Ursula Beath, RN Scrub Person: Leighton Parody; Maureen Ralphs, RN  Estimated Blood Loss: Minimal               Specimens: sent to path          [facility]   Date: 06/22/2012  Time: 12:49 PM

## 2012-06-22 NOTE — Anesthesia Preprocedure Evaluation (Addendum)
Anesthesia Evaluation  Patient identified by MRN, date of birth, ID band Patient awake    Reviewed: Allergy & Precautions, H&P , NPO status , Patient's Chart, lab work & pertinent test results  Airway Mallampati: II  Neck ROM: full    Dental  (+) Dental Advisory Given   Pulmonary          Cardiovascular hypertension,     Neuro/Psych    GI/Hepatic   Endo/Other    Renal/GU      Musculoskeletal   Abdominal   Peds  Hematology   Anesthesia Other Findings   Reproductive/Obstetrics                          Anesthesia Physical Anesthesia Plan  ASA: II  Anesthesia Plan: General   Post-op Pain Management:    Induction: Intravenous  Airway Management Planned: Oral ETT  Additional Equipment:   Intra-op Plan:   Post-operative Plan: Extubation in OR  Informed Consent: I have reviewed the patients History and Physical, chart, labs and discussed the procedure including the risks, benefits and alternatives for the proposed anesthesia with the patient or authorized representative who has indicated his/her understanding and acceptance.     Plan Discussed with: CRNA and Surgeon  Anesthesia Plan Comments:         Anesthesia Quick Evaluation

## 2012-06-22 NOTE — Progress Notes (Deleted)
MD called i.e plts. Elevation & decreased urinary output @ [phone]

## 2012-06-23 LAB — URINE CULTURE: Colony Count: >=100000

## 2012-06-23 LAB — COMPREHENSIVE METABOLIC PANEL
Alkaline Phosphatase: 86 U/L (ref 39–117)
BUN: 4 mg/dL — ABNORMAL LOW (ref 6–23)
GFR calc Af Amer: >90 mL/min (ref >90)
GFR calc non Af Amer: >90 mL/min (ref >90)
Glucose, Bld: 127 mg/dL — ABNORMAL HIGH (ref 70–99)
Potassium: 3.4 mEq/L — ABNORMAL LOW (ref 3.5–5.1)
Total Bilirubin: 0.9 mg/dL (ref 0.3–1.2)
Total Protein: 6.6 g/dL (ref 6.0–8.3)

## 2012-06-23 MED ORDER — CIPROFLOXACIN HCL 500 MG PO TABS: 500.0000 mg | ORAL_TABLET | Freq: Two times a day (BID) | ORAL | Status: DC

## 2012-06-23 MED ORDER — OXYCODONE-ACETAMINOPHEN 5-325 MG PO TABS
1.0000 | ORAL_TABLET | Freq: Four times a day (QID) | ORAL | Status: DC | PRN
Start: 2012-06-23 — End: 2012-07-12

## 2012-06-23 NOTE — Anesthesia Postprocedure Evaluation (Signed)
  Anesthesia Post-op Note  Patient: Christina Weiss  Procedure(s) Performed: Procedure(s): LAPAROSCOPIC CHOLECYSTECTOMY WITH ATTEMPTED CHOLANGIOGRAM (N/A)  Patient Location: Nursing Unit  Anesthesia Type:General  Level of Consciousness: awake, alert  and oriented  Airway and Oxygen Therapy: Patient Spontanous Breathing  Post-op Pain: mild  Post-op Assessment: Post-op Vital signs reviewed and Patient's Cardiovascular Status Stable  Post-op Vital Signs: Reviewed and stable  Complications: No apparent anesthesia complications

## 2012-06-23 NOTE — Progress Notes (Signed)
Discharge patient. Home discharge instruction given, no questions verbalized. 

## 2012-06-23 NOTE — Progress Notes (Signed)
I have seen and examined the patient and agree with the assessment and plans.  Naiyah Klostermann A. Brynn Reznik  MD, FACS  

## 2012-06-23 NOTE — Progress Notes (Signed)
Physician Discharge Summary  Patient ID: Christina Weiss MRN: [id number] DOB/AGE: 05-Nov-1970 42 y.o.  Admit date: 06/21/2012 Discharge date: 06/23/2012  Admitting Diagnosis: RUQ abdominal pain Nausea/vomiting Acute cholecystitis   Discharge Diagnosis Patient Active Problem List   Diagnosis Date Noted  . Cholecystitis, acute with cholelithiasis 06/21/2012    Consultants None  Imaging: US Abdomen Complete  06/21/2012  *RADIOLOGY REPORT*  Clinical Data:  Right upper quadrant pain  COMPLETE ABDOMINAL ULTRASOUND  Comparison:  None.  Findings:  Gallbladder:  Multiple mobile echogenic foci with posterior acoustic shadowing in the gallbladder consistent with cholelithiasis.  The gallbladder wall is thickened to 9 mm and there is some reactive hypoechoic change in the adjacent hepatic surface.  Per the sonographer, the sonographic Murphy's sign was positive.  Common bile duct:  Prominence of the common bile duct which measures up to 13 mm in the porta hepatis and 8 mm more distally toward the pancreatic head.  No definite choledocholithiasis identified.  Liver:  No focal lesion.  Hepatic parenchyma is diffusely echogenic and coarsened consistent with steatosis.  IVC:  Appears normal.  Pancreas:  Limited evaluation secondary to obscuring bowel gas.  Spleen:  Sonographically unremarkable.  11.2 cm in length.  Right Kidney:  Sonographically unremarkable.  No hydronephrosis, nephrolithiasis or solid lesion.  12.8 cm in length.  Left Kidney:  Sonographically unremarkable.  No hydronephrosis, nephrolithiasis or solid lesion.  13.1 cm in length.  Abdominal aorta:  No aneurysm identified.  IMPRESSION:  1.  Sonographic findings are consistent with acute cholecystitis.  2.  Proximally enlarged common bile duct without visualized choledocholithiasis.  3.  Hepatic steatosis Negative abdominal ultrasound.   Original Report Authenticated By: Malachy Moan, M.D.     Procedures Laparoscopic  cholecystectomy  Hospital Course:  42 y/o female presented to [facility] with 1 day hx of N/V/RUQ abdominal pain severe and constant. No flank pain.    Workup showed ultrasound findings consistent with acute cholecystitis, mild LFT elevation, pyuria of unknown significance.  She was treated with antibiotics for her UTI symptoms, pain meds, and antiemetics.  Patient was admitted and underwent procedure listed above.  Tolerated procedure well and was transferred to the floor.  Diet was advanced as tolerated.  On POD #1, the patient was voiding well, tolerating diet, ambulating well, pain well controlled, vital signs stable, incisions c/d/i and felt stable for discharge home.  Patient will follow up in our office in 2-3 weeks and knows to call with questions or concerns.  Physical Exam: General:  Alert, NAD, pleasant, comfortable Abd:  Soft, mild distension, mild tenderness, incisions C/D/I    Medication List    TAKE these medications       ALKA-SELTZER PO  Take 2 tablets by mouth daily as needed. For upset stomach     ciprofloxacin 500 MG tablet  Commonly known as:  CIPRO  Take 1 tablet (500 mg total) by mouth 2 (two) times daily.     hydrochlorothiazide 25 MG tablet  Commonly known as:  HYDRODIURIL  Take 12.5 mg by mouth daily.     ibuprofen 200 MG tablet  Commonly known as:  ADVIL,MOTRIN  Take 400 mg by mouth daily as needed for pain.     oxyCODONE-acetaminophen 5-325 MG per tablet  Commonly known as:  PERCOCET/ROXICET  Take 1-2 tablets by mouth every 6 (six) hours as needed.             Follow-up Information   Follow up with [facility]  [place] On 07/12/2012. (APPOINTMENT ON 07/12/12, AT 11:30AM, ARRIVE AT 11:00AM FOR CHECK IN)    Contact information:   [address]   [place] [address] [phone]       Signed: Candiss Norse [facility] [phone]  06/23/2012, 9:07 AM

## 2012-06-24 ENCOUNTER — Encounter (HOSPITAL_COMMUNITY): Payer: Self-pay | Admitting: Surgery

## 2012-07-12 ENCOUNTER — Encounter (INDEPENDENT_AMBULATORY_CARE_PROVIDER_SITE_OTHER): Payer: Self-pay

## 2012-07-12 ENCOUNTER — Ambulatory Visit (INDEPENDENT_AMBULATORY_CARE_PROVIDER_SITE_OTHER): Payer: Self-pay | Admitting: General Surgery

## 2012-07-12 VITALS — BP 136/90 | HR 71 | Temp 97.4°F | Ht 63.75 in | Wt 243.2 lb

## 2012-07-12 DIAGNOSIS — K810 Acute cholecystitis: Secondary | ICD-10-CM

## 2012-07-12 NOTE — Progress Notes (Signed)
Christina Weiss [facility] 03-13-1971 [id number] 07/12/2012   Christina Weiss is a 42 y.o. female who had a laparoscopic cholecystectomy with intraoperative cholangiogram by Dr. Magnus Ivan.  The pathology report confirmed Chronic active cholecystitis, cholelithiasis.  The patient reports that they are feeling well with normal bowel movements and good appetite.  The pre-operative symptoms of abdominal pain, nausea, and vomiting have resolved.    Physical examination - Incisions appear well-healed with no sign of infection or bleeding.   Abdomen - soft, non-tender BP 136/90  Pulse 71  Temp(Src) 97.4 F (36.3 C) (Temporal)  Ht 5' 3.75" (1.619 m)  Wt 243 lb 3.2 oz (110.315 kg)  BMI 42.09 kg/m2  SpO2 98%  LMP 05/25/2012 Impression:  s/p laparoscopic cholecystectomy  Plan:  She may resume a regular diet and full activity.  She may follow-up on a PRN basis.

## 2012-07-12 NOTE — Patient Instructions (Signed)
Resume your regular activities, call us if you need [place].

## 2013-01-18 ENCOUNTER — Ambulatory Visit (HOSPITAL_COMMUNITY)
Admission: RE | Admit: 2013-01-18 | Discharge: 2013-01-18 | Disposition: A | Discharge status: Home / Self Care | Payer: No Typology Code available for payment source | Source: Ambulatory Visit | Attending: Internal Medicine | Admitting: Internal Medicine

## 2013-01-18 ENCOUNTER — Other Ambulatory Visit (HOSPITAL_COMMUNITY): Payer: Self-pay | Admitting: Internal Medicine

## 2013-01-18 DIAGNOSIS — R52 Pain, unspecified: Secondary | ICD-10-CM

## 2013-01-18 DIAGNOSIS — M1710 Unilateral primary osteoarthritis, unspecified knee: Secondary | ICD-10-CM | POA: Insufficient documentation

## 2013-03-23 ENCOUNTER — Other Ambulatory Visit (HOSPITAL_COMMUNITY): Payer: Self-pay | Admitting: Internal Medicine

## 2013-03-23 DIAGNOSIS — R1011 Right upper quadrant pain: Secondary | ICD-10-CM

## 2013-03-27 ENCOUNTER — Ambulatory Visit (HOSPITAL_COMMUNITY)
Admission: RE | Admit: 2013-03-27 | Discharge: 2013-03-27 | Disposition: A | Discharge status: Home / Self Care | Payer: No Typology Code available for payment source | Source: Ambulatory Visit | Attending: Internal Medicine | Admitting: Internal Medicine

## 2013-03-27 DIAGNOSIS — R1011 Right upper quadrant pain: Secondary | ICD-10-CM | POA: Insufficient documentation

## 2013-03-27 DIAGNOSIS — K7689 Other specified diseases of liver: Secondary | ICD-10-CM | POA: Insufficient documentation

## 2013-03-27 DIAGNOSIS — Z9089 Acquired absence of other organs: Secondary | ICD-10-CM | POA: Insufficient documentation

## 2013-03-27 DIAGNOSIS — R10811 Right upper quadrant abdominal tenderness: Secondary | ICD-10-CM | POA: Insufficient documentation

## 2014-02-12 ENCOUNTER — Other Ambulatory Visit: Payer: Self-pay | Admitting: Internal Medicine

## 2014-02-12 DIAGNOSIS — Z1231 Encounter for screening mammogram for malignant neoplasm of breast: Secondary | ICD-10-CM

## 2014-03-05 ENCOUNTER — Ambulatory Visit
Admission: RE | Admit: 2014-03-05 | Discharge: 2014-03-05 | Disposition: A | Discharge status: Home / Self Care | Payer: PRIVATE HEALTH INSURANCE | Source: Ambulatory Visit | Attending: Internal Medicine | Admitting: Internal Medicine

## 2014-03-05 DIAGNOSIS — Z1231 Encounter for screening mammogram for malignant neoplasm of breast: Secondary | ICD-10-CM

## 2015-02-26 ENCOUNTER — Other Ambulatory Visit: Payer: Self-pay

## 2015-02-26 DIAGNOSIS — Z1231 Encounter for screening mammogram for malignant neoplasm of breast: Secondary | ICD-10-CM

## 2015-03-07 ENCOUNTER — Ambulatory Visit
Admission: RE | Admit: 2015-03-07 | Discharge: 2015-03-07 | Disposition: A | Discharge status: Home / Self Care | Payer: No Typology Code available for payment source | Source: Ambulatory Visit

## 2015-03-07 DIAGNOSIS — Z1231 Encounter for screening mammogram for malignant neoplasm of breast: Secondary | ICD-10-CM

## 2015-05-16 ENCOUNTER — Other Ambulatory Visit: Payer: Self-pay

## 2015-05-16 DIAGNOSIS — Z1231 Encounter for screening mammogram for malignant neoplasm of breast: Secondary | ICD-10-CM

## 2015-06-04 ENCOUNTER — Ambulatory Visit
Admission: RE | Admit: 2015-06-04 | Discharge: 2015-06-04 | Disposition: A | Discharge status: Home / Self Care | Payer: No Typology Code available for payment source | Source: Ambulatory Visit

## 2015-06-04 DIAGNOSIS — Z1231 Encounter for screening mammogram for malignant neoplasm of breast: Secondary | ICD-10-CM

## 2015-12-18 ENCOUNTER — Emergency Department (HOSPITAL_COMMUNITY)
Admission: EM | Admit: 2015-12-18 | Discharge: 2015-12-18 | Disposition: A | Discharge status: Home / Self Care | Payer: Medicaid Other | Attending: Emergency Medicine | Admitting: Emergency Medicine

## 2015-12-18 ENCOUNTER — Encounter (HOSPITAL_COMMUNITY): Payer: Self-pay | Admitting: *Deleted

## 2015-12-18 DIAGNOSIS — I10 Essential (primary) hypertension: Secondary | ICD-10-CM | POA: Insufficient documentation

## 2015-12-18 DIAGNOSIS — R102 Pelvic and perineal pain: Secondary | ICD-10-CM | POA: Diagnosis not present

## 2015-12-18 DIAGNOSIS — O209 Hemorrhage in early pregnancy, unspecified: Secondary | ICD-10-CM | POA: Diagnosis present

## 2015-12-18 DIAGNOSIS — O200 Threatened abortion: Secondary | ICD-10-CM | POA: Diagnosis not present

## 2015-12-18 DIAGNOSIS — Z3A08 8 weeks gestation of pregnancy: Secondary | ICD-10-CM | POA: Diagnosis not present

## 2015-12-18 DIAGNOSIS — Z79899 Other long term (current) drug therapy: Secondary | ICD-10-CM | POA: Insufficient documentation

## 2015-12-18 LAB — COMPREHENSIVE METABOLIC PANEL
ALT: 19 U/L (ref 14–54)
AST: 19 U/L (ref 15–41)
Albumin: 4 g/dL (ref 3.5–5.0)
Alkaline Phosphatase: 51 U/L (ref 38–126)
Anion gap: 8 (ref 5–15)
BUN: 8 mg/dL (ref 6–20)
CO2: 22 mmol/L (ref 22–32)
Calcium: 9.7 mg/dL (ref 8.9–10.3)
Chloride: 108 mmol/L (ref 101–111)
Creatinine, Ser: 0.48 mg/dL (ref 0.44–1.00)
GFR calc Af Amer: >60 mL/min (ref >60)
GFR calc non Af Amer: >60 mL/min (ref >60)
Glucose, Bld: 111 mg/dL — ABNORMAL HIGH (ref 65–99)
Potassium: 3.6 mmol/L (ref 3.5–5.1)
Sodium: 138 mmol/L (ref 135–145)
Total Bilirubin: 0.9 mg/dL (ref 0.3–1.2)
Total Protein: 7 g/dL (ref 6.5–8.1)

## 2015-12-18 LAB — CBC
HCT: 37.6 % (ref 36.0–46.0)
Hemoglobin: 12.2 g/dL (ref 12.0–15.0)
MCH: 28.4 pg (ref 26.0–34.0)
MCHC: 32.4 g/dL (ref 30.0–36.0)
MCV: 87.6 fL (ref 78.0–100.0)
Platelets: 188 10*3/uL (ref 150–400)
RBC: 4.29 MIL/uL (ref 3.87–5.11)
RDW: 13.6 % (ref 11.5–15.5)
WBC: 5 10*3/uL (ref 4.0–10.5)

## 2015-12-18 LAB — URINE MICROSCOPIC-ADD ON
Bacteria, UA: NONE SEEN
Squamous Epithelial / LPF: NONE SEEN
WBC, UA: NONE SEEN WBC/hpf (ref 0–5)

## 2015-12-18 LAB — LIPASE, BLOOD: Lipase: 28 U/L (ref 11–51)

## 2015-12-18 LAB — URINALYSIS, ROUTINE W REFLEX MICROSCOPIC
Bilirubin Urine: NEGATIVE
Glucose, UA: NEGATIVE mg/dL
Ketones, ur: NEGATIVE mg/dL
Nitrite: NEGATIVE
Protein, ur: 100 mg/dL — AB
Specific Gravity, Urine: 1.01 (ref 1.005–1.030)
pH: 6.5 (ref 5.0–8.0)

## 2015-12-18 LAB — I-STAT BETA HCG BLOOD, ED (MC, WL, AP ONLY): I-stat hCG, quantitative: >2000 m[IU]/mL — ABNORMAL HIGH (ref <5)

## 2015-12-18 LAB — ABO/RH: ABO/RH(D): A POS

## 2015-12-18 LAB — HCG, QUANTITATIVE, PREGNANCY: HCG, BETA CHAIN, QUANT, S: 4605 m[IU]/mL — AB (ref <5)

## 2015-12-18 MED ORDER — HYDROCODONE-ACETAMINOPHEN 5-325 MG PO TABS: 1.0000 | ORAL_TABLET | Freq: Four times a day (QID) | ORAL | 0 refills | Status: DC | PRN

## 2015-12-18 MED ORDER — ACETAMINOPHEN 500 MG PO TABS
1000.0000 mg | ORAL_TABLET | Freq: Once | ORAL | Status: AC
  Administered 2015-12-18: 1000 mg via ORAL
  Filled 2015-12-18: qty 2

## 2015-12-18 NOTE — ED Triage Notes (Signed)
Pt reports multiple +home preg test. This am having abd cramping, got up to use restroom and reports gush of clear fluid and now bright red bleeding.

## 2015-12-18 NOTE — ED Notes (Signed)
Pt speaks spanish. Daughters at bedside, clear translate.

## 2015-12-18 NOTE — ED Provider Notes (Signed)
[facility] DEPT Provider Note   CSN: [id number] Arrival date & time: 12/18/15  0810     History                        Chief Complaint    Chief Complaint  Patient presents with  . Abdominal Pain  . Vaginal Bleeding    HPI Christina Weiss is a 45 y.o. female.  Patient presents to the ED with a chief complaint of abdominal/pelvic pain and vaginal bleeding.  Onset last night into this morning. Pain is moderate to severe and described as cramping. She states that she had a positive pregnancy test earlier this week. She states that her LMP was in the middle of June.  She denies any other symptoms.    The history is provided by the patient. The history is limited by a language barrier. A language interpreter was used.        Past Medical History:  Diagnosis Date  . Hypertension         Patient Active Problem List   Diagnosis Date Noted  . Cholecystitis, acute with cholelithiasis 06/21/2012         Past Surgical History:  Procedure Laterality Date  . ANKLE SURGERY    . CHOLECYSTECTOMY N/A 06/22/2012   Procedure: LAPAROSCOPIC CHOLECYSTECTOMY WITH ATTEMPTED CHOLANGIOGRAM;  Surgeon: Shelly Rubensteinouglas A Blackman, MD;  Location: [facility] OR;  Service: General;  Laterality: N/A;       OB History    No data available       Home Medications                                Prior to Admission medications   Medication Sig Start Date End Date Taking? Authorizing Provider  lisinopril-hydrochlorothiazide (PRINZIDE,ZESTORETIC) 20-25 MG tablet Take 1 tablet by mouth daily.   Yes Historical Provider, MD  hydrochlorothiazide (HYDRODIURIL) 25 MG tablet Take 12.5 mg by mouth daily.     Historical Provider, MD    Family History History reviewed. No pertinent family history.  Social History     Social History  Substance Use Topics  . Smoking status: Never Smoker  . Smokeless tobacco: Not on file  . Alcohol use No     Allergies                      Review of patient's allergies indicates no known allergies.   Review of Systems Review of Systems  Genitourinary: Positive for vaginal bleeding.  All other systems reviewed and are negative.    Physical Exam Updated Vital Signs BP 162/98 (BP Location: Left Arm)   Pulse 67   Temp 98.1 F (36.7 C) (Oral)   Resp 18   LMP 10/17/2015   SpO2 99%   Physical Exam  Constitutional: She is oriented to person, place, and time. She appears well-developed and well-nourished.  HENT:  Head: Normocephalic and atraumatic.  Eyes: Conjunctivae and EOM are normal. Pupils are equal, round, and reactive to light.  Neck: Normal range of motion. Neck supple.  Cardiovascular: Normal rate and regular rhythm.  Exam reveals no gallop and no friction rub.   No murmur heard. Pulmonary/Chest: Effort normal and breath sounds normal. No respiratory distress. She has no wheezes. She has no rales. She exhibits no tenderness.  Abdominal: Soft. Bowel sounds are normal. She exhibits no distension and no mass. There is no tenderness. There  is no rebound and no guarding.  Genitourinary:  Genitourinary Comments: Large tennis ball sized clot evacuated from vaginal canal, no active hemorrhage, products of conception seen at cervix, significant uterine tenderness, Chaperone present Musculoskeletal: Normal range of motion. She exhibits no edema or tenderness.  Neurological: She is alert and oriented to person, place, and time.  Skin: Skin is warm and dry.  Psychiatric: She has a normal mood and affect. Her behavior is normal. Judgment and thought content normal.  Nursing note and vitals reviewed.    ED Treatments / Results  Labs (all labs ordered are listed, but only abnormal results are displayed)      Labs Reviewed  I-STAT BETA HCG BLOOD, ED ([facility], [facility], [facility] ONLY) - Abnormal; Notable for the following:       Result Value    I-stat hCG, quantitative >2,000.0 (*)    All other components within normal  limits  WET PREP, GENITAL  LIPASE, BLOOD  COMPREHENSIVE METABOLIC PANEL  CBC  URINALYSIS, ROUTINE W REFLEX MICROSCOPIC (NOT AT [facility])  HCG, QUANTITATIVE, PREGNANCY  ABO/RH  GC/CHLAMYDIA PROBE AMP ([place]) NOT AT [facility]    EKG      EKG Interpretation None      Radiology Imaging Results (Last 48 hours)  No results found.    Procedures Procedures (including critical care time)  Medications Ordered in ED Medications - No data to display   Initial Impression / Assessment and Plan / ED Course  I have reviewed the triage vital signs and the nursing notes.  Pertinent labs & imaging results that were available during my care of the patient were reviewed by me and considered in my medical decision making (see chart for details).  Clinical Course    Patient with vaginal bleeding that started last night, also reports abdominal cramping. Positive pregnancy test last week. Concern for threatened abortion. Large amount of blood evacuated from vaginal canal. The blood did not reaccumulate, no evidence of hemorrhage. There were products of conception seen in the vaginal canal and at the cervix. I discussed the case with Dr. Juleen ChinaKohut, who agrees that imaging is not indicated at this time. We'll check Rh and H&H, and reassess. Patient will need close OB/GYN follow-up.  Patient is Rh+, no indication for rhogam. H&H is stable. Discussed the results and diagnosis with patient in detail using the interpreter phone.  She understands and agrees with plan for follow-up in 3 days.  Final Clinical Impressions(s) / ED Diagnoses        1. Miscarriage, (threatened) New Prescriptions    New Prescriptions   No medications on file      Roxy HorsemanRobert Michelle Vanhise, PA-C 12/18/15 1045    Raeford RazorStephen Kohut, MD 12/22/15 [phone]

## 2015-12-23 ENCOUNTER — Other Ambulatory Visit: Payer: Medicaid Other

## 2015-12-23 DIAGNOSIS — O039 Complete or unspecified spontaneous abortion without complication: Secondary | ICD-10-CM

## 2015-12-24 LAB — HCG, QUANTITATIVE, PREGNANCY: hCG, Beta Chain, Quant, S: 79.4 m[IU]/mL — ABNORMAL HIGH

## 2015-12-26 ENCOUNTER — Telehealth: Payer: Self-pay | Admitting: General Practice

## 2015-12-26 NOTE — Telephone Encounter (Signed)
Per Dr Adrian BlackwaterStinson, patient's bhcg has dropped significantly & she requires no further follow up unless she does not get a period in the next 6-8 weeks. Called patient with Christina Weiss for interpreter & informed patient of results & recommendations. Patient verbalized understanding & states she is still bleeding some and wants to know if this is normal. Told patient yes it can be because her levels weren't quite at 0 yet so her body is still getting rid of the pregnancy. Patient verbalized understanding & had no questions

## 2016-02-14 ENCOUNTER — Encounter (HOSPITAL_COMMUNITY): Payer: Self-pay

## 2016-02-14 ENCOUNTER — Emergency Department (HOSPITAL_COMMUNITY)
Admission: EM | Admit: 2016-02-14 | Discharge: 2016-02-14 | Disposition: A | Discharge status: Home / Self Care | Payer: Medicaid Other | Attending: Emergency Medicine | Admitting: Emergency Medicine

## 2016-02-14 DIAGNOSIS — L97229 Non-pressure chronic ulcer of left calf with unspecified severity: Secondary | ICD-10-CM | POA: Insufficient documentation

## 2016-02-14 DIAGNOSIS — I10 Essential (primary) hypertension: Secondary | ICD-10-CM | POA: Insufficient documentation

## 2016-02-14 DIAGNOSIS — L089 Local infection of the skin and subcutaneous tissue, unspecified: Secondary | ICD-10-CM | POA: Diagnosis present

## 2016-02-14 DIAGNOSIS — I872 Venous insufficiency (chronic) (peripheral): Secondary | ICD-10-CM | POA: Insufficient documentation

## 2016-02-14 DIAGNOSIS — IMO0002 Reserved for concepts with insufficient information to code with codable children: Secondary | ICD-10-CM

## 2016-02-14 MED ORDER — CEPHALEXIN 250 MG PO CAPS
500.0000 mg | ORAL_CAPSULE | Freq: Once | ORAL | Status: AC
  Administered 2016-02-14: 500 mg via ORAL
  Filled 2016-02-14: qty 2

## 2016-02-14 MED ORDER — HYDROCODONE-ACETAMINOPHEN 5-325 MG PO TABS: 1.0000 | ORAL_TABLET | Freq: Four times a day (QID) | ORAL | 0 refills | Status: DC | PRN

## 2016-02-14 MED ORDER — HYDROCODONE-ACETAMINOPHEN 5-325 MG PO TABS
1.0000 | ORAL_TABLET | Freq: Once | ORAL | Status: AC
  Administered 2016-02-14: 1 via ORAL
  Filled 2016-02-14: qty 1

## 2016-02-14 MED ORDER — CEPHALEXIN 500 MG PO CAPS: 500.0000 mg | ORAL_CAPSULE | Freq: Four times a day (QID) | ORAL | 0 refills | Status: DC

## 2016-02-14 NOTE — ED Triage Notes (Signed)
Per Pt, Pt speaks Spanish. Pt reports having infection to the left leg that started about a week ago. Reports seeing PCP and sent over here for evaluation.

## 2016-02-14 NOTE — ED Provider Notes (Signed)
[facility] Provider Note   CSN: [id number] Arrival date & time: 02/14/16  1045     History   Chief Complaint Chief Complaint  Patient presents with  . Wound Infection    HPI Christina Weiss is a 45 y.o. female presenting for evaluation of her chronic venous stasis dermatitis which is suspected to be infected.  She was seen by her pcp today at her family medicine clinic today due to increased pain and an ulcer that has been present for the past month and now felt to be infected.  She is scheduled to have a surgery on November 28th at [facility] where she was referred to a vascular surgeon.  She denies fevers, chills, vomiting but does endorse Nausea, especially when her pain becomes severe.  Husband at the bedside endorses she doesn't sleep well at night secondary to pain.  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  She has taken ibuprofen without relief of pain.  She states the ulcer scabs over, but then sloughs and drains clear to white fluid.       The history is provided by the patient and a relative. The history is limited by a language barrier. A language interpreter was used.    Past Medical History:  Diagnosis Date  . Hypertension     Patient Active Problem List   Diagnosis Date Noted  . Cholecystitis, acute with cholelithiasis 06/21/2012    Past Surgical History:  Procedure Laterality Date  . ANKLE SURGERY    . CHOLECYSTECTOMY N/A 06/22/2012   Procedure: LAPAROSCOPIC CHOLECYSTECTOMY WITH ATTEMPTED CHOLANGIOGRAM;  Surgeon: Shelly Rubenstein, MD;  Location: [facility] OR;  Service: General;  Laterality: N/A;    OB History    No data available       Home Medications    Prior to Admission medications   Medication Sig Start Date End Date Taking? Authorizing Provider  cephALEXin (KEFLEX) 500 MG capsule Take 1 capsule (500 mg total) by mouth 4 (four)  times daily. 02/14/16   Burgess Amor, PA-C  hydrochlorothiazide (HYDRODIURIL) 25 MG tablet Take 12.5 mg by mouth daily.     Historical Provider, MD  HYDROcodone-acetaminophen (NORCO/VICODIN) 5-325 MG tablet Take 1 tablet by mouth every 6 (six) hours as needed for moderate pain. 02/14/16   Burgess Amor, PA-C  lisinopril-hydrochlorothiazide (PRINZIDE,ZESTORETIC) 20-25 MG tablet Take 1 tablet by mouth daily.    Historical Provider, MD    Family History No family history on file.  Social History Social History  Substance Use Topics  . Smoking status: Never Smoker  . Smokeless tobacco: Never Used  . Alcohol use No     Allergies   Review of patient's allergies indicates no known allergies.   Review of Systems Review of Systems  Constitutional: Negative for chills and fever.  Respiratory: Negative for shortness of breath and wheezing.   Skin: Positive for color change and wound.  Neurological: Negative for numbness.     Physical Exam Updated Vital Signs BP 141/78 (BP Location: Right Arm)   Pulse (!) 59   Temp 97.2 F (36.2 C) (Oral)   Resp 16   SpO2 98%   Physical Exam  Constitutional: She appears well-developed and well-nourished. No distress.  HENT:  Head: Normocephalic.  Neck: Neck supple.  Cardiovascular: Normal rate.   Pulmonary/Chest: Effort normal. She has no wheezes.  Musculoskeletal: Normal range of motion. She exhibits  no edema.  Skin:  Brawniness to left lower extremity from mid tibia through ankle.  Moderate diffuse varicosities present.  There is a 1 cm ulceration mid medial left lower leg which has healthy-appearing granulation tissue within the ulcer site.  Dorsalis pedis pulses are full and equal bilaterally.  There is increased warmth surrounding the ulceration along with some mild erythema along the upper border of the area brawniness.  There is no red streaking.  There is no fluctuance or drainage from the ulcer site.     ED Treatments / Results   Labs (all labs ordered are listed, but only abnormal results are displayed) Labs Reviewed - No data to display  EKG  EKG Interpretation None       Radiology No results found.  Procedures Procedures (including critical care time)  Medications Ordered in ED Medications  HYDROcodone-acetaminophen (NORCO/VICODIN) 5-325 MG per tablet 1 tablet (not administered)  cephALEXin (KEFLEX) capsule 500 mg (not administered)     Initial Impression / Assessment and Plan / ED Course  I have reviewed the triage vital signs and the nursing notes.  Pertinent labs & imaging results that were available during my care of the patient were reviewed by me and considered in my medical decision making (see chart for details).  Clinical Course   Patient was started on Keflex with first dose given here.  Prescribed hydrocodone.  Discussed elevation of her lower extremities is much as possible, avoiding long periods of ending.  Also discussed compression stockings which she states she has but doesn't like to wear, she was encouraged to do this.  Plan follow-up with PCP as needed [facility].  Her wound was also dressed using a wet-to-dry dressing and instructed in daily dressing changes.   Final Clinical Impressions(s) / ED Diagnoses   Final diagnoses:  Chronic venous stasis dermatitis  Ulceration (HCC)    New Prescriptions New Prescriptions   CEPHALEXIN (KEFLEX) 500 MG CAPSULE    Take 1 capsule (500 mg total) by mouth 4 (four) times daily.   HYDROCODONE-ACETAMINOPHEN (NORCO/VICODIN) 5-325 MG TABLET    Take 1 tablet by mouth every 6 (six) hours as needed for moderate pain.     Burgess AmorJulie Brogen Duell, PA-C 02/14/16 1805    Maia PlanJoshua G Long, MD 02/15/16 [phone]

## 2016-02-14 NOTE — Discharge Instructions (Signed)
Take your entire course of the antibiotic.  You may take the hydrocodone prescribed for pain relief.  This will make you drowsy - do not drive within 4 hours of taking this medication.

## 2016-06-30 ENCOUNTER — Other Ambulatory Visit: Payer: Self-pay | Admitting: Internal Medicine

## 2016-06-30 DIAGNOSIS — Z1231 Encounter for screening mammogram for malignant neoplasm of breast: Secondary | ICD-10-CM

## 2016-09-28 ENCOUNTER — Encounter (HOSPITAL_COMMUNITY): Payer: Self-pay

## 2016-09-28 ENCOUNTER — Inpatient Hospital Stay (HOSPITAL_COMMUNITY)
Admission: AD | Admit: 2016-09-28 | Discharge: 2016-09-28 | Disposition: A | Discharge status: Home / Self Care | Payer: Medicaid Other | Source: Ambulatory Visit | Attending: Obstetrics and Gynecology | Admitting: Obstetrics and Gynecology

## 2016-09-28 ENCOUNTER — Inpatient Hospital Stay (HOSPITAL_COMMUNITY): Payer: Medicaid Other

## 2016-09-28 DIAGNOSIS — Z3A01 Less than 8 weeks gestation of pregnancy: Secondary | ICD-10-CM | POA: Diagnosis not present

## 2016-09-28 DIAGNOSIS — R109 Unspecified abdominal pain: Secondary | ICD-10-CM

## 2016-09-28 DIAGNOSIS — R1032 Left lower quadrant pain: Secondary | ICD-10-CM | POA: Diagnosis not present

## 2016-09-28 DIAGNOSIS — O09521 Supervision of elderly multigravida, first trimester: Secondary | ICD-10-CM | POA: Diagnosis not present

## 2016-09-28 DIAGNOSIS — Z79899 Other long term (current) drug therapy: Secondary | ICD-10-CM | POA: Diagnosis not present

## 2016-09-28 DIAGNOSIS — O26891 Other specified pregnancy related conditions, first trimester: Secondary | ICD-10-CM | POA: Diagnosis not present

## 2016-09-28 DIAGNOSIS — O9989 Other specified diseases and conditions complicating pregnancy, childbirth and the puerperium: Secondary | ICD-10-CM | POA: Diagnosis not present

## 2016-09-28 DIAGNOSIS — O26899 Other specified pregnancy related conditions, unspecified trimester: Secondary | ICD-10-CM

## 2016-09-28 DIAGNOSIS — R1031 Right lower quadrant pain: Secondary | ICD-10-CM | POA: Insufficient documentation

## 2016-09-28 DIAGNOSIS — O3680X0 Pregnancy with inconclusive fetal viability, not applicable or unspecified: Secondary | ICD-10-CM

## 2016-09-28 HISTORY — DX: Asymptomatic varicose veins of bilateral lower extremities: I83.93

## 2016-09-28 LAB — OB RESULTS CONSOLE GC/CHLAMYDIA: GC PROBE AMP, GENITAL: NEGATIVE

## 2016-09-28 LAB — CBC
HCT: 36.1 % (ref 36.0–46.0)
Hemoglobin: 11.7 g/dL — ABNORMAL LOW (ref 12.0–15.0)
MCH: 28.1 pg (ref 26.0–34.0)
MCHC: 32.4 g/dL (ref 30.0–36.0)
MCV: 86.6 fL (ref 78.0–100.0)
PLATELETS: 203 10*3/uL (ref 150–400)
RBC: 4.17 MIL/uL (ref 3.87–5.11)
RDW: 14.3 % (ref 11.5–15.5)
WBC: 5.2 10*3/uL (ref 4.0–10.5)

## 2016-09-28 LAB — URINALYSIS, ROUTINE W REFLEX MICROSCOPIC
Bilirubin Urine: NEGATIVE
Glucose, UA: NEGATIVE mg/dL
Hgb urine dipstick: NEGATIVE
Ketones, ur: NEGATIVE mg/dL
Nitrite: NEGATIVE
Protein, ur: NEGATIVE mg/dL
RBC / HPF: NONE SEEN RBC/hpf (ref 0–5)
SPECIFIC GRAVITY, URINE: 1.014 (ref 1.005–1.030)
pH: 6 (ref 5.0–8.0)

## 2016-09-28 LAB — WET PREP, GENITAL
CLUE CELLS WET PREP: NONE SEEN
Sperm: NONE SEEN
TRICH WET PREP: NONE SEEN
Yeast Wet Prep HPF POC: NONE SEEN

## 2016-09-28 LAB — POCT PREGNANCY, URINE: PREG TEST UR: POSITIVE — AB

## 2016-09-28 LAB — HCG, QUANTITATIVE, PREGNANCY: hCG, Beta Chain, Quant, S: 1350 m[IU]/mL — ABNORMAL HIGH (ref <5)

## 2016-09-28 LAB — ABO/RH: ABO/RH(D): A POS

## 2016-09-28 MED ORDER — LABETALOL HCL 100 MG PO TABS: 100.0000 mg | ORAL_TABLET | Freq: Two times a day (BID) | ORAL | 1 refills | Status: DC

## 2016-09-28 NOTE — Discharge Instructions (Signed)
Embarazo ectópico °(Ectopic Pregnancy) °Un embarazo ectópico ocurre cuando un óvulo fecundado se desarrolla fuera del útero. Un embarazo no puede subsistir fuera del útero. Este problema generalmente ocurre en las trompas de Falopio. La causa es, con frecuencia, un daño en la trompa de Falopio. °Si el problema se detecta a tiempo, puede tratarse con medicamentos. Si el conducto se fisura o estalla (hay ruptura), tendrá una hemorragia interna. Esto es una emergencia. Deberá someterse a una cirugía. Solicite ayuda de inmediato. °SÍNTOMAS °Al principio puede tener síntomas de un embarazo normal. Estos pueden ser: °· Falta del período menstrual. °· Ganas de vomitar (náuseas). °· Sensación de cansancio. °· Hinchazón de las mamas. °Luego podrá a comenzar a tener síntomas que no son normales. Estos pueden ser: °· Dolor durante el coito (relación sexual). °· Hemorragia por la vagina. Esto incluye el sangrado leve (pérdidas). °· Calambres o dolor en el vientre (abdomen) o en la zona inferior del vientre. Estos pueden sentirse en uno de los lados. °· Latidos cardíacos rápidos (pulso). °· Perder la conciencia (desmayarse) después de ir de cuerpo (defecar). °Si el conducto se rompe, puede tener síntomas como: °· Dolor muy intenso en el vientre o parte baja del vientre. Esto se produce repentinamente. °· Mareos. °· Desmayo. °· Dolor en el hombro. °SOLICITE AYUDA DE INMEDIATO SI: °Tiene alguno de estos síntomas. Esto es una emergencia. °ASEGÚRESE DE QUE: °· Comprende estas instrucciones. °· Controlará su afección. °· Recibirá ayuda de inmediato si no mejora o si empeora. ° °Esta información no tiene como fin reemplazar el consejo del médico. Asegúrese de hacerle al médico cualquier pregunta que tenga. °Document Released: 04/09/2011 Document Revised: 04/25/2013 Document Reviewed: 11/30/2012 °Elsevier Interactive Patient Education © 2017 Elsevier Inc. ° °

## 2016-09-28 NOTE — MAU Note (Signed)
Patient presents with positive UPT at home, having lower abdominal pain since this morning, has history of miscarriage, denies vaginal bleeding, stopped taking her BP medication 2 weeks ago because she thought she might be pregnant, LMP 08/17/16

## 2016-09-28 NOTE — MAU Provider Note (Signed)
Patient Christina Weiss is a 46 y.o. [phone] [redacted]w[redacted]d by uncertain LMP/irregular periods who is here because she recently had a positive pregnancy test at home and is now having abdominal pain. Denies irregular discharge and bleeding, dysuria, lower back pain.   Her history is significant for [facility]; she has been taking lisinopril and HCTZ but has stopped taking her lisinopril because of her pregnancy.  History     CSN: [id number]  Arrival date and time: 09/28/16 1722   None     Chief Complaint  Patient presents with  . Possible Pregnancy  . Abdominal Pain   Abdominal Pain  This is a new problem. The current episode started today. The onset quality is sudden. The problem occurs intermittently. The problem has been unchanged. The pain is located in the LLQ and RLQ. The pain is at a severity of 6/10. The quality of the pain is aching. The abdominal pain radiates to the LLQ and RLQ. Pertinent negatives include no constipation, diarrhea, dysuria, frequency, headaches or nausea. Nothing aggravates the pain. The pain is relieved by nothing. She has tried nothing for the symptoms.    OB History    Gravida Para Term Preterm AB Living   5 3 3  0 1 3   SAB TAB Ectopic Multiple Live Births   0 1 0 0 3      Past Medical History:  Diagnosis Date  . Hypertension   . Varicose veins of both lower extremities     Past Surgical History:  Procedure Laterality Date  . ANKLE SURGERY    . CHOLECYSTECTOMY N/A 06/22/2012   Procedure: LAPAROSCOPIC CHOLECYSTECTOMY WITH ATTEMPTED CHOLANGIOGRAM;  Surgeon: Shelly Rubenstein, MD;  Location: [facility] OR;  Service: General;  Laterality: N/A;  . DILATION AND CURETTAGE OF UTERUS      History reviewed. No pertinent family history.  Social History  Substance Use Topics  . Smoking status: Never Smoker  . Smokeless tobacco: Never Used  . Alcohol use No    Allergies: No Known Allergies  Prescriptions Prior to Admission  Medication Sig Dispense Refill Last Dose   . cephALEXin (KEFLEX) 500 MG capsule Take 1 capsule (500 mg total) by mouth 4 (four) times daily. 40 capsule 0   . hydrochlorothiazide (HYDRODIURIL) 25 MG tablet Take 12.5 mg by mouth daily.    Taking  . HYDROcodone-acetaminophen (NORCO/VICODIN) 5-325 MG tablet Take 1 tablet by mouth every 6 (six) hours as needed for moderate pain. 30 tablet 0   . lisinopril-hydrochlorothiazide (PRINZIDE,ZESTORETIC) 20-25 MG tablet Take 1 tablet by mouth daily.   Past Week at Unknown time    Review of Systems  Respiratory: Negative.   Cardiovascular: Negative.   Gastrointestinal: Positive for abdominal pain. Negative for constipation, diarrhea and nausea.  Genitourinary: Negative for dysuria and frequency.  Neurological: Negative for headaches.  Psychiatric/Behavioral: Negative.    Physical Exam   Blood pressure 133/76, pulse 70, temperature 97.9 F (36.6 C), temperature source Oral, resp. rate 16, weight 243 lb 0.6 oz (110.2 kg), last menstrual period 08/17/2016.  Physical Exam  Constitutional: She is oriented to person, place, and time. She appears well-developed.  HENT:  Head: Normocephalic.  Neck: Normal range of motion.  Respiratory: Effort normal.  GI: Soft.  Genitourinary:  Genitourinary Comments: NEFG; no CMT, no suprapubic tenderness or adnexal tenderness.   Musculoskeletal: Normal range of motion.  Neurological: She is alert and oriented to person, place, and time.  Skin: Skin is warm and dry.  Psychiatric: She has a  normal mood and affect.    Results for orders placed or performed during the hospital encounter of 09/28/16 (from the past 24 hour(s))  Urinalysis, Routine w reflex microscopic     Status: Abnormal   Collection Time: 09/28/16  6:00 PM  Result Value Ref Range   Color, Urine YELLOW YELLOW   APPearance CLEAR CLEAR   Specific Gravity, Urine 1.014 1.005 - 1.030   pH 6.0 5.0 - 8.0   Glucose, UA NEGATIVE NEGATIVE mg/dL   Hgb urine dipstick NEGATIVE NEGATIVE   Bilirubin  Urine NEGATIVE NEGATIVE   Ketones, ur NEGATIVE NEGATIVE mg/dL   Protein, ur NEGATIVE NEGATIVE mg/dL   Nitrite NEGATIVE NEGATIVE   Leukocytes, UA TRACE (A) NEGATIVE   RBC / HPF NONE SEEN 0 - 5 RBC/hpf   WBC, UA 0-5 0 - 5 WBC/hpf   Bacteria, UA RARE (A) NONE SEEN   Squamous Epithelial / LPF 0-5 (A) NONE SEEN  Pregnancy, urine POC     Status: Abnormal   Collection Time: 09/28/16  6:12 PM  Result Value Ref Range   Preg Test, Ur POSITIVE (A) NEGATIVE  Wet prep, genital     Status: Abnormal   Collection Time: 09/28/16  7:33 PM  Result Value Ref Range   Yeast Wet Prep HPF POC NONE SEEN NONE SEEN   Trich, Wet Prep NONE SEEN NONE SEEN   Clue Cells Wet Prep HPF POC NONE SEEN NONE SEEN   WBC, Wet Prep HPF POC FEW (A) NONE SEEN   Sperm NONE SEEN   CBC     Status: Abnormal   Collection Time: 09/28/16  7:42 PM  Result Value Ref Range   WBC 5.2 4.0 - 10.5 K/uL   RBC 4.17 3.87 - 5.11 MIL/uL   Hemoglobin 11.7 (L) 12.0 - 15.0 g/dL   HCT 11.936.1 14.736.0 - 82.946.0 %   MCV 86.6 78.0 - 100.0 fL   MCH 28.1 26.0 - 34.0 pg   MCHC 32.4 30.0 - 36.0 g/dL   RDW 56.214.3 13.011.5 - 86.515.5 %   Platelets 203 150 - 400 K/uL  ABO/Rh     Status: None   Collection Time: 09/28/16  7:42 PM  Result Value Ref Range   ABO/RH(D) A POS   hCG, quantitative, pregnancy     Status: Abnormal   Collection Time: 09/28/16  7:42 PM  Result Value Ref Range   hCG, Beta Chain, Quant, S 1,350 (H) <5 mIU/mL   [place] Ob Comp Less 14 Wks  Result Date: 09/28/2016 CLINICAL DATA:  Pain.  Pregnant patient. EXAM: OBSTETRIC <14 WK US AND TRANSVAGINAL OB US TECHNIQUE: Both transabdominal and transvaginal ultrasound examinations were performed for complete evaluation of the gestation as well as the maternal uterus, adnexal regions, and pelvic cul-de-sac. Transvaginal technique was performed to assess early pregnancy. COMPARISON:  None. FINDINGS: Intrauterine gestational sac: A tiny fluid collection measuring up to 4.4 mm any diameter is identified in the  endometrium. This could represent a very early gestational sac but is nonspecific. Yolk sac:  Not Visualized. Embryo:  Not Visualized. MSD: 4.4  mm   5 w   1  d Subchorionic hemorrhage:  None visualized. Maternal uterus/adnexae: IMPRESSION: 1. No definitive intrauterine pregnancy. However, the tiny fluid collection in the endometrium could represent a very early gestational sac but is too small to be definitive. Recommend clinical correlation an short-term follow-up. Electronically Signed   By: Gerome Samavid  Williams III M.D   On: 09/28/2016 21:07   [place] Ob Transvaginal  Result Date: 09/28/2016 CLINICAL DATA:  Pain.  Pregnant patient. EXAM: OBSTETRIC <14 WK [place] AND TRANSVAGINAL OB US TECHNIQUE: Both transabdominal and transvaginal ultrasound examinations were performed for complete evaluation of the gestation as well as the maternal uterus, adnexal regions, and pelvic cul-de-sac. Transvaginal technique was performed to assess early pregnancy. COMPARISON:  None. FINDINGS: Intrauterine gestational sac: A tiny fluid collection measuring up to 4.4 mm any diameter is identified in the endometrium. This could represent a very early gestational sac but is nonspecific. Yolk sac:  Not Visualized. Embryo:  Not Visualized. MSD: 4.4  mm   5 w   1  d Subchorionic hemorrhage:  None visualized. Maternal uterus/adnexae: IMPRESSION: 1. No definitive intrauterine pregnancy. However, the tiny fluid collection in the endometrium could represent a very early gestational sac but is too small to be definitive. Recommend clinical correlation an short-term follow-up. Electronically Signed   By: Gerome Sam III M.D   On: 09/28/2016 21:07    MAU Course  Procedures  MDM -ABO, CBC, betahCg pending -[place] results  Speculum exam not done due to lack of discharge or bleeding.  GC CT and wet prep blind cultures collected.   Care of patient endorsed to Christina Weiss CNM at 2030.  Christina Weiss   Assessment and Plan   1. Pregnancy,  location unknown   2. Abdominal pain affecting pregnancy    DC home Since patient has already stopped her lisinopril, will switch her to labetalol 100mg  BID. FU in 48 hours for repeat blood work. Will schedule clinic appointment once there is confirmed pregnancy.  Comfort measures reviewed  1st trimester precautions  Bleeding precautions Ectopic precautions RX: labetalol 100mg  BID #60  Return to MAU as needed FU with OB as planned  Follow-up Information    [facility] up.   Specialty:  Obstetrics and Gynecology Why:  Thursday morning at 8:00 am for repeat blood work  Contact information: [address] [place] [address] [phone]            Charlesetta Garibaldi Kooistra 09/28/2016, 7:49 PM

## 2016-09-29 LAB — GC/CHLAMYDIA PROBE AMP (~~LOC~~) NOT AT ARMC
Chlamydia: NEGATIVE
Neisseria Gonorrhea: NEGATIVE

## 2016-10-01 ENCOUNTER — Ambulatory Visit: Payer: Medicaid Other | Admitting: General Practice

## 2016-10-01 ENCOUNTER — Telehealth: Payer: Self-pay | Admitting: General Practice

## 2016-10-01 DIAGNOSIS — O3680X0 Pregnancy with inconclusive fetal viability, not applicable or unspecified: Secondary | ICD-10-CM

## 2016-10-01 LAB — HCG, QUANTITATIVE, PREGNANCY: HCG, BETA CHAIN, QUANT, S: 4193 m[IU]/mL — AB (ref <5)

## 2016-10-01 NOTE — Telephone Encounter (Signed)
Called patient with pacific interpreter [phone] and informed her of bhcg results & ultrasound appt. Patient verbalized understanding and is aware of office visit to follow for results. Patient had no questions

## 2016-10-01 NOTE — Progress Notes (Signed)
Patient here for stat bhcg today. Patient denies pain or bleeding. Will call patient with results. Patient provided phone number [phone]. Per Dr Vergie LivingPickens, bhcg has increased appropriately. Patient needs follow up ultrasound in 2 weeks. Scheduled for 6/11 @ 10am. Will call patient with results.

## 2016-10-12 ENCOUNTER — Ambulatory Visit: Payer: Medicaid Other

## 2016-10-12 ENCOUNTER — Ambulatory Visit (HOSPITAL_COMMUNITY): Admission: RE | Admit: 2016-10-12 | Payer: Medicaid Other | Source: Ambulatory Visit

## 2016-10-15 ENCOUNTER — Ambulatory Visit: Payer: Medicaid Other

## 2016-10-15 ENCOUNTER — Ambulatory Visit (HOSPITAL_COMMUNITY)
Admission: RE | Admit: 2016-10-15 | Discharge: 2016-10-15 | Disposition: A | Discharge status: Home / Self Care | Payer: Medicaid Other | Source: Ambulatory Visit | Attending: Obstetrics and Gynecology | Admitting: Obstetrics and Gynecology

## 2016-10-15 DIAGNOSIS — Z3490 Encounter for supervision of normal pregnancy, unspecified, unspecified trimester: Secondary | ICD-10-CM | POA: Diagnosis not present

## 2016-10-15 DIAGNOSIS — O3680X0 Pregnancy with inconclusive fetal viability, not applicable or unspecified: Secondary | ICD-10-CM

## 2016-11-20 ENCOUNTER — Inpatient Hospital Stay (HOSPITAL_COMMUNITY)
Admission: AD | Admit: 2016-11-20 | Discharge: 2016-11-20 | Disposition: A | Discharge status: Home / Self Care | Payer: Medicaid Other | Source: Ambulatory Visit | Attending: Obstetrics and Gynecology | Admitting: Obstetrics and Gynecology

## 2016-11-20 ENCOUNTER — Encounter (HOSPITAL_COMMUNITY): Payer: Self-pay | Admitting: *Deleted

## 2016-11-20 DIAGNOSIS — O468X1 Other antepartum hemorrhage, first trimester: Secondary | ICD-10-CM | POA: Insufficient documentation

## 2016-11-20 DIAGNOSIS — O26859 Spotting complicating pregnancy, unspecified trimester: Secondary | ICD-10-CM

## 2016-11-20 DIAGNOSIS — O26851 Spotting complicating pregnancy, first trimester: Secondary | ICD-10-CM | POA: Diagnosis present

## 2016-11-20 DIAGNOSIS — Z3A13 13 weeks gestation of pregnancy: Secondary | ICD-10-CM | POA: Insufficient documentation

## 2016-11-20 LAB — URINALYSIS, ROUTINE W REFLEX MICROSCOPIC
Bilirubin Urine: NEGATIVE
Glucose, UA: NEGATIVE mg/dL
Ketones, ur: NEGATIVE mg/dL
Nitrite: NEGATIVE
PH: 6.5 (ref 5.0–8.0)
PROTEIN: NEGATIVE mg/dL
SPECIFIC GRAVITY, URINE: 1.01 (ref 1.005–1.030)

## 2016-11-20 LAB — URINALYSIS, MICROSCOPIC (REFLEX): RBC / HPF: NONE SEEN RBC/hpf (ref 0–5)

## 2016-11-20 NOTE — MAU Note (Signed)
Pt reports she started having bleeding and cramping today. Reports she is also having chills.

## 2016-11-20 NOTE — MAU Provider Note (Signed)
History     CSN: [id number]  Arrival date and time: 11/20/16 0030   First Provider Initiated Contact with Patient 11/20/16 [phone]      Chief Complaint  Patient presents with  . Vaginal Bleeding  . Abdominal Pain   HPI Christina Weiss is a 46 y.o. [phone] at 3111w4d who presents to MAU today with complaint of spotting with wiping since this afternoon. She states that she has also had mild suprapubic pain that comes and goes. She denies pain now. She has not taken anything for pain. She states that she has had chills today, but only when she has the pain. Last intercourse was yesterday. She is going to [facility] for prenatal care and had SIUP on US at 7570w5d.    OB History    Gravida Para Term Preterm AB Living   5 3 3  0 1 3   SAB TAB Ectopic Multiple Live Births   0 1 0 0 3      Past Medical History:  Diagnosis Date  . Hypertension   . Varicose veins of both lower extremities     Past Surgical History:  Procedure Laterality Date  . ANKLE SURGERY    . CHOLECYSTECTOMY N/A 06/22/2012   Procedure: LAPAROSCOPIC CHOLECYSTECTOMY WITH ATTEMPTED CHOLANGIOGRAM;  Surgeon: Shelly Rubensteinouglas A Blackman, MD;  Location: [facility] OR;  Service: General;  Laterality: N/A;  . DILATION AND CURETTAGE OF UTERUS      History reviewed. No pertinent family history.  Social History  Substance Use Topics  . Smoking status: Never Smoker  . Smokeless tobacco: Never Used  . Alcohol use No    Allergies: No Known Allergies  Prescriptions Prior to Admission  Medication Sig Dispense Refill Last Dose  . cephALEXin (KEFLEX) 500 MG capsule Take 1 capsule (500 mg total) by mouth 4 (four) times daily. 40 capsule 0   . HYDROcodone-acetaminophen (NORCO/VICODIN) 5-325 MG tablet Take 1 tablet by mouth every 6 (six) hours as needed for moderate pain. 30 tablet 0   . labetalol (NORMODYNE) 100 MG tablet Take 1 tablet (100 mg total) by mouth 2 (two) times daily. 60 tablet 1     Review of Systems  Constitutional: Negative  for fever.  Gastrointestinal: Positive for abdominal pain. Negative for constipation, diarrhea, nausea and vomiting.  Genitourinary: Positive for vaginal bleeding. Negative for vaginal discharge.   Physical Exam   Blood pressure (!) 146/82, pulse 70, temperature 98.3 F (36.8 C), temperature source Oral, resp. rate 17, height 5\' 3"  (1.6 m), weight 244 lb (110.7 kg), last menstrual period 08/17/2016, SpO2 100 %.  Physical Exam  Nursing note and vitals reviewed. Constitutional: She is oriented to person, place, and time. She appears well-developed and well-nourished. No distress.  HENT:  Head: Normocephalic and atraumatic.  Cardiovascular: Normal rate.   Respiratory: Effort normal.  GI: Soft. She exhibits no distension and no mass. There is no tenderness. There is no rebound and no guarding.  Genitourinary: Uterus is enlarged (appropriate for GA). Uterus is not tender. Cervix exhibits no motion tenderness, no discharge and no friability. There is bleeding (scant brown) in the vagina. No vaginal discharge found.  Neurological: She is alert and oriented to person, place, and time.  Skin: Skin is warm and dry. No erythema.  Psychiatric: She has a normal mood and affect.     MAU Course  Procedures Pt informed that the ultrasound is considered a limited OB ultrasound and is not intended to be a complete ultrasound exam.  Patient  also informed that the ultrasound is not being completed with the intent of assessing for fetal or placental anomalies or any pelvic abnormalities.  Explained that the purpose of today's ultrasound is to assess for  viability.  Patient acknowledges the purpose of the exam and the limitations of the study.    MDM Bedside US today shows normal appears FHR and + FM.  Cervix closed. Scant blood on exam.   Assessment and Plan  A: SIUP at [redacted]w[redacted]d Post coital bleeding   P: Discharge home Tylenol PRN for pain First trimester precautions discussed Patient advised to  follow-up with [facility] as planned for routine prenatal care  Patient may return to MAU as needed or if her condition were to change or worsen   Vonzella Nipple, PA-C 11/20/2016, 1:16 AM

## 2016-11-20 NOTE — Discharge Instructions (Signed)
Reposo pélvico °(Pelvic Rest) °¿CUÁNDO SE RECOMIENDA EL REPOSO PÉLVICO? °El reposo pélvico puede recomendarse en los siguientes casos: °· La placenta cubre de forma parcial o total la abertura del cuello del útero (placenta previa). °· Hay sangrado entre la pared del útero y el saco amniótico en el primer trimestre de embarazo (hemorragia subcoriónica). °· El trabajo de parto comienza muy pronto (trabajo de parto prematuro). °Según la salud general de la madre y el feto, el médico decidirá si el reposo pélvico es adecuado. °¿CÓMO HAGO REPOSO PÉLVICO? °Durante el tiempo que le indique el médico: °· No tenga relaciones sexuales, estimulación sexual ni orgasmos. °· No use tampones. No se haga duchas vaginales. No se introduzca nada en la vagina. °· No levante ningún objeto que pese más de 10 libras (4,5 kg). °· Evite las actividades que demanden mucho esfuerzo (extenuantes). °· Evite las actividades que requieran esfuerzos de los músculos de la pelvis. °¿CUÁNDO DEBO BUSCAR ATENCIÓN MÉDICA? °Solicite atención médica de inmediato si: °· Tiene cólicos en la zona inferior del abdomen. °· Tiene secreción de flujo vaginal. °· Tiene un dolor sordo en la parte baja de la espalda. °· Tiene contracciones regulares. °· Tienen tensión uterina. °¿CUÁNDO DEBO BUSCAR ASISTENCIA MÉDICA INMEDIATA? °Solicite atención médica de inmediato si: °· Tiene sangrado vaginal y está embarazada. °Esta información no tiene como fin reemplazar el consejo del médico. Asegúrese de hacerle al médico cualquier pregunta que tenga. °Document Released: 01/13/2012 Document Revised: 08/12/2015 Document Reviewed: 10/22/2014 °Elsevier Interactive Patient Education © 2018 Elsevier Inc. ° °

## 2016-12-07 ENCOUNTER — Inpatient Hospital Stay (HOSPITAL_COMMUNITY)
Admission: AD | Admit: 2016-12-07 | Discharge: 2016-12-07 | Disposition: A | Discharge status: Home / Self Care | Payer: Medicaid Other | Source: Ambulatory Visit | Attending: Obstetrics & Gynecology | Admitting: Obstetrics & Gynecology

## 2016-12-07 ENCOUNTER — Encounter (HOSPITAL_COMMUNITY): Payer: Self-pay

## 2016-12-07 DIAGNOSIS — Z9889 Other specified postprocedural states: Secondary | ICD-10-CM | POA: Diagnosis not present

## 2016-12-07 DIAGNOSIS — O09523 Supervision of elderly multigravida, third trimester: Secondary | ICD-10-CM

## 2016-12-07 DIAGNOSIS — O4692 Antepartum hemorrhage, unspecified, second trimester: Secondary | ICD-10-CM | POA: Diagnosis not present

## 2016-12-07 DIAGNOSIS — O09529 Supervision of elderly multigravida, unspecified trimester: Secondary | ICD-10-CM

## 2016-12-07 DIAGNOSIS — O10919 Unspecified pre-existing hypertension complicating pregnancy, unspecified trimester: Secondary | ICD-10-CM | POA: Diagnosis present

## 2016-12-07 DIAGNOSIS — N841 Polyp of cervix uteri: Secondary | ICD-10-CM

## 2016-12-07 DIAGNOSIS — O3442 Maternal care for other abnormalities of cervix, second trimester: Secondary | ICD-10-CM | POA: Insufficient documentation

## 2016-12-07 DIAGNOSIS — Z8742 Personal history of other diseases of the female genital tract: Secondary | ICD-10-CM

## 2016-12-07 DIAGNOSIS — Z3A14 14 weeks gestation of pregnancy: Secondary | ICD-10-CM | POA: Insufficient documentation

## 2016-12-07 DIAGNOSIS — O0990 Supervision of high risk pregnancy, unspecified, unspecified trimester: Secondary | ICD-10-CM

## 2016-12-07 DIAGNOSIS — O09522 Supervision of elderly multigravida, second trimester: Secondary | ICD-10-CM | POA: Insufficient documentation

## 2016-12-07 DIAGNOSIS — O10012 Pre-existing essential hypertension complicating pregnancy, second trimester: Secondary | ICD-10-CM | POA: Diagnosis not present

## 2016-12-07 HISTORY — DX: Calculus of gallbladder with acute cholecystitis without obstruction: K80.00

## 2016-12-07 MED ORDER — PREPLUS 27-1 MG PO TABS: 1.0000 | ORAL_TABLET | Freq: Every day | ORAL | 13 refills | Status: DC

## 2016-12-07 MED ORDER — LABETALOL HCL 100 MG PO TABS: 200.0000 mg | ORAL_TABLET | Freq: Once | ORAL | Status: DC

## 2016-12-07 NOTE — MAU Note (Signed)
Sent urine to lab

## 2016-12-07 NOTE — Discharge Instructions (Signed)
Biopsia cervicouterina, cuidados posteriores (Cervical Biopsy, Care After) Siga estas instrucciones durante las prximas semanas. Estas indicaciones le proporcionan informacin acerca de cmo deber cuidarse despus del procedimiento. El mdico tambin podr darle instrucciones ms especficas. El tratamiento ha sido planificado segn las prcticas mdicas actuales, pero en algunos casos pueden ocurrir problemas. Comunquese con el mdico si tiene algn problema o dudas despus del procedimiento. QU ESPERAR DESPUS DEL PROCEDIMIENTO Despus del procedimiento, es comn [employer] siguientes sntomas:  Calambres abdominales o dolor leve durante [address].  Hemorragia vaginal leve durante algunos das.  Secrecin vaginal oscura durante [employer]. INSTRUCCIONES PARA EL CUIDADO EN EL HOGAR  Tome los medicamentos de venta libre y los recetados solamente como se lo haya indicado el mdico.  Reanude sus actividades normales como se lo haya indicado el mdico. Pregntele al mdico qu actividades son seguras para usted.  Use una compresa higinica hasta que la hemorragia y la secrecin se detengan.  No use tampones hasta que el mdico la autorice.  No se haga duchas vaginales hasta que el mdico la autorice.  No tenga relaciones sexuales hasta que el mdico la autorice.  Concurra a todas las visitas de control como se lo haya indicado el mdico. Esto es importante. SOLICITE ATENCIN MDICA SI:  Tiene fiebre o siente escalofros.  Tiene secrecin vaginal con mal olor.  Tiene picazn o irritacin alrededor de la vagina.  Siente dolor en la parte inferior del abdomen. SOLICITE ATENCIN MDICA DE INMEDIATO SI:  Tiene hemorragia vaginal abundante que empapa ms de una compresa higinica por hora.  Se desmaya.  Siente un dolor muy intenso en la parte inferior del abdomen. Esta informacin no tiene Theme park manager el consejo del mdico. Asegrese de hacerle al mdico cualquier  pregunta que tenga. Document Released: 01/09/2015 Document Revised: 01/09/2015 Document Reviewed: 09/05/2014 Elsevier Interactive Patient Education  2018 Elsevier Inc.    Hemorragia vaginal durante el embarazo (segundo trimestre) (Vaginal Bleeding During Pregnancy, Second Trimester) Durante el embarazo es relativamente frecuente que se presente una pequea hemorragia (manchas). Esta situacin generalmente mejora por s misma. Hay diversos factores que pueden causar hemorragia o Games developer. Algunas manchas pueden estar relacionadas al [employer] y otras no. A veces, la hemorragia es normal y no es un problema. Sin embargo, la hemorragia tambin puede ser un signo de algo grave. Debe informar a su mdico de inmediato si tiene alguna hemorragia vaginal. Algunas causas posibles de hemorragia vaginal durante el segundo trimestre incluyen:  Infeccin, inflamacin o crecimientos anormales en el cuello del tero.  La placenta cubre parcial o completamente la abertura del cuello del tero (placenta previa).  La placenta puede haberse separado del tero (abrupcin placentaria).  Usted puede estar sufriendo un trabajo de parto prematuro.  Es probable que el cuello del tero no sea lo suficientemente resistente para Pharmacologist un beb dentro del tero (insuficiencia cervical).  Se pueden haber desarrollado pequeos quistes en el tero en lugar de tejido de embarazo (embarazo molar). INSTRUCCIONES PARA EL CUIDADO EN EL HOGAR Controle su afeccin para ver si hay cambios. Las siguientes indicaciones ayudarn a Psychologist, educational [employer] pueda sentir:  Siga las indicaciones del mdico para restringir su actividad. Si el mdico le indica descanso en la cama, debe quedarse en la cama y levantarse solo para ir al bao. No obstante, el mdico puede permitirle que continu con tareas livianas.  Si es necesario, organcese para que alguien le ayude con las actividades y responsabilidades cotidianas  mientras est en  camaMeriel Flavors.  Lleve un registro de la cantidad y la saturacin de las toallas higinicas que Landscape architectutiliza cada da. Anote este dato.  No use tampones.No se haga duchas vaginales.  No tenga relaciones sexuales u orgasmos hasta que el mdico la autorice.  Si elimina tejido por la vagina, gurdelo para mostrrselo al American Expressmdico.  [place] solo medicamentos de venta libre o recetados, segn las indicaciones del mdico.  No tome aspirina, ya que puede causar hemorragias.  No realice ejercicio ni ninguna actividad intensa, ni levante objetos pesados sin el permiso de su mdico.  Cumpla con todas las visitas de control, segn le indique su mdico. SOLICITE ATENCIN MDICA SI:  Tiene un sangrado vaginal en cualquier momento del embarazo.  Tiene calambres o dolores de [place].  Tiene fiebre que los medicamentos no Sports coachpueden controlar. SOLICITE ATENCIN MDICA DE INMEDIATO SI:  Siente calambres intensos en la espalda o en el vientre (abdomen).  Siente contracciones.  Tiene escalofros.  Elimina cogulos grandes o tejido por la vagina.  La hemorragia aumenta.  Si se siente mareada, dbil o se desmaya.  Tiene una prdida importante o sale lquido a borbotones por la vagina. ASEGRESE DE QUE:  Comprende estas instrucciones.  Controlar su afeccin.  Recibir ayuda de inmediato si no mejora o si empeora. Esta informacin no tiene Theme park managercomo fin reemplazar el consejo del mdico. Asegrese de hacerle al mdico cualquier pregunta que tenga. Document Released: 01/28/2005 Document Revised: 04/25/2013 Document Reviewed: 12/26/2012 Elsevier Interactive Patient Education  [employer]2018 Elsevier Inc.

## 2016-12-07 NOTE — MAU Note (Signed)
Pt reports lower abdominal cramping and bright red vaginal bleeding when wiping that started about 3 hours ago. Has not taken anything for pain-rates 7/10

## 2016-12-07 NOTE — MAU Provider Note (Signed)
[facility] OB[facility]N Attending MAU Note  Chief Complaint: Vaginal Bleeding    First Provider Initiated Contact with Patient 12/07/16 0135      SUBJECTIVE Christina Weiss is a 46 y.o. [id number] at 3265w2d by LMP who presents with bright red vaginal bleeding and cramping that started 3 hours prior to arrival. No preceding intercourse, heavy lifting, or other strenuous activity.  Associated with mild lower abdominal cramping.  Was seen on 11/20/16 for postcoital bleeding. [facility]  She speaks Spanish, daughter interpreted for her as per her request in lieu of [employer] video interpreter (refusal of interpreter services form signed).  Past Medical History:  Diagnosis Date  . Cholecystitis, acute with cholelithiasis 06/21/2012  . Hypertension   . Varicose veins of both lower extremities    OB History  Gravida Para Term Preterm AB Living  5 3 3  0 1 3  SAB TAB Ectopic Multiple Live Births  0 1 0 0 3    # Outcome Date GA Lbr Len/2nd Weight Sex Delivery Anes PTL Lv  5 Current           4 TAB           3 Term      Vag-Spont     2 Term      Vag-Spont     1 Term      Vag-Spont        Past Surgical History:  Procedure Laterality Date  . ANKLE SURGERY    . CHOLECYSTECTOMY N/A 06/22/2012   Procedure: LAPAROSCOPIC CHOLECYSTECTOMY WITH ATTEMPTED CHOLANGIOGRAM;  Surgeon: Shelly Rubensteinouglas A Blackman, MD;  Location: [facility] OR;  Service: General;  Laterality: N/A;  . DILATION AND CURETTAGE OF UTERUS     Social History   Social History  . Marital status: Married    Spouse name: N/A  . Number of children: N/A  . Years of education: N/A   Occupational History  . Not on file.   Social History Main Topics  . Smoking status: Never Smoker  . Smokeless tobacco: Never Used  . Alcohol use No  . Drug use: No  . Sexual activity: Yes   Other Topics Concern  . Not on file   Social History Narrative  . No narrative on file   No current facility-administered medications on file prior  to encounter.    Current Outpatient Prescriptions on File Prior to Encounter  Medication Sig Dispense Refill  . labetalol (NORMODYNE) 100 MG tablet Take 1 tablet (100 mg total) by mouth 2 (two) times daily. 60 tablet 1   No Known Allergies  ROS: Pertinent items in HPI  OBJECTIVE BP (!) 146/79 (BP Location: Right Arm)   Pulse 70   Temp 97.7 F (36.5 C) (Oral)   Resp 18   Ht 5\' 3"  (1.6 m)   Wt 241 lb (109.3 kg)   LMP 08/17/2016   SpO2 100%   BMI 42.69 kg/m  CONSTITUTIONAL: Well-developed, well-nourished female in no acute distress.  HENT:  Normocephalic, atraumatic, External right and left ear normal. Oropharynx is clear and moist EYES: Conjunctivae and EOM are normal. Pupils are equal, round, and reactive to light. No scleral icterus.  NECK: Normal range of motion, supple, no masses.  Normal thyroid.  SKIN: Skin is warm and dry. No rash noted. Not diaphoretic. No erythema. No pallor. NEUROLGIC: Alert and oriented to person, place, and time. Normal reflexes, muscle tone coordination. No cranial nerve deficit noted. PSYCHIATRIC: Normal mood and affect. Normal  behavior. Normal judgment and thought content. CARDIOVASCULAR: Normal heart rate noted RESPIRATORY: Effort and breath sounds normal, no problems with respiration noted. ABDOMEN: Soft, normal bowel sounds, no distention noted.  No tenderness, rebound or guarding.  PELVIC: Normal appearing external genitalia; normal appearing vaginal mucosa.  Large 2 cm, friable, sessile polypoid lesion with narrow stalk noted at external os.  Touching it with fox swab caused bleeding.  Closed and long cervix.  Scant red-brown discharge. NMUSCULOSKELETAL: Normal range of motion. No tenderness.  No cyanosis, clubbing, or edema.  2+ distal pulses.  LAB RESULTS No results found for this or any previous visit (from the past 48 hour(s)).  IMAGING Bedside scan: Viable SIUP, nml AFV, nml placenta, +fetal movement  MAU COURSE CERVICAL POLYPECTOMY  NOTE Risks of the biopsy including pain, bleeding, infection, inadequate specimen, and need for additional procedures were discussed. The patient stated understanding and agreed to undergo procedure today. Verbal consent obtained to remove polyp.   Ring forceps were used to grasp the polypoid lesion and the polypoid lesions was removed by twisting it off its base.  Tissue sent to pathology for analysis.  Small bleeding was noted and hemostasis was achieved using silver nitrate sticks.  The patient tolerated the procedure well. Post-procedure instructions were given to the patient. The patient is to call with heavy bleeding, fever greater than 100.4, foul smelling vaginal discharge or other concerns.   ASSESSMENT 1. Second trimester bleeding   2. Polyp at cervical os   3. Status post cervical polypectomy   4. Preexisting hypertension complicating pregnancy, antepartum   5. Antepartum multigravida of advanced maternal age   46. Supervision of high risk pregnancy, antepartum     PLAN Second trimester bleeding precautions reviewed Will follow up pathology results Message sent to clinic to schedule initial OB appointment given high risk factors Continue Labetalol for [facility] for now. Prenatal vitamins ordered.  Allergies as of 12/07/2016   No Known Allergies     Medication List    TAKE these medications   labetalol 100 MG tablet Commonly known as:  NORMODYNE Take 1 tablet (100 mg total) by mouth 2 (two) times daily.   PREPLUS 27-1 MG Tabs Take 1 tablet by mouth daily.        Tereso Newcomer, MD 12/07/2016 2:15 AM

## 2016-12-11 ENCOUNTER — Inpatient Hospital Stay (HOSPITAL_COMMUNITY)
Admission: AD | Admit: 2016-12-11 | Discharge: 2016-12-11 | Disposition: A | Discharge status: Home / Self Care | Payer: Medicaid Other | Source: Ambulatory Visit | Attending: Obstetrics & Gynecology | Admitting: Obstetrics & Gynecology

## 2016-12-11 ENCOUNTER — Inpatient Hospital Stay (HOSPITAL_COMMUNITY): Payer: Medicaid Other

## 2016-12-11 ENCOUNTER — Encounter (HOSPITAL_COMMUNITY): Payer: Self-pay

## 2016-12-11 DIAGNOSIS — O209 Hemorrhage in early pregnancy, unspecified: Secondary | ICD-10-CM | POA: Insufficient documentation

## 2016-12-11 DIAGNOSIS — O09522 Supervision of elderly multigravida, second trimester: Secondary | ICD-10-CM | POA: Insufficient documentation

## 2016-12-11 DIAGNOSIS — O26892 Other specified pregnancy related conditions, second trimester: Secondary | ICD-10-CM | POA: Diagnosis present

## 2016-12-11 DIAGNOSIS — O162 Unspecified maternal hypertension, second trimester: Secondary | ICD-10-CM | POA: Insufficient documentation

## 2016-12-11 DIAGNOSIS — O0990 Supervision of high risk pregnancy, unspecified, unspecified trimester: Secondary | ICD-10-CM

## 2016-12-11 DIAGNOSIS — I10 Essential (primary) hypertension: Secondary | ICD-10-CM

## 2016-12-11 DIAGNOSIS — Z3A16 16 weeks gestation of pregnancy: Secondary | ICD-10-CM | POA: Diagnosis not present

## 2016-12-11 DIAGNOSIS — O4692 Antepartum hemorrhage, unspecified, second trimester: Secondary | ICD-10-CM

## 2016-12-11 DIAGNOSIS — O10912 Unspecified pre-existing hypertension complicating pregnancy, second trimester: Secondary | ICD-10-CM | POA: Diagnosis not present

## 2016-12-11 DIAGNOSIS — O10919 Unspecified pre-existing hypertension complicating pregnancy, unspecified trimester: Secondary | ICD-10-CM

## 2016-12-11 DIAGNOSIS — O0992 Supervision of high risk pregnancy, unspecified, second trimester: Secondary | ICD-10-CM | POA: Diagnosis not present

## 2016-12-11 DIAGNOSIS — R109 Unspecified abdominal pain: Secondary | ICD-10-CM | POA: Diagnosis present

## 2016-12-11 LAB — URINALYSIS, ROUTINE W REFLEX MICROSCOPIC
Bilirubin Urine: NEGATIVE
GLUCOSE, UA: NEGATIVE mg/dL
KETONES UR: NEGATIVE mg/dL
LEUKOCYTES UA: NEGATIVE
Nitrite: NEGATIVE
PROTEIN: NEGATIVE mg/dL
Specific Gravity, Urine: 1.005 (ref 1.005–1.030)
pH: 6 (ref 5.0–8.0)

## 2016-12-11 MED ORDER — LABETALOL HCL 200 MG PO TABS: 200.0000 mg | ORAL_TABLET | Freq: Two times a day (BID) | ORAL | 3 refills | Status: DC

## 2016-12-11 NOTE — MAU Provider Note (Signed)
Chief Complaint: Abdominal Pain and Vaginal Bleeding   None     SUBJECTIVE HPI: Christina Weiss is a 46 y.o. [id number] at 921w6d by LMP who presents to maternity admissions reporting moderate dark red vaginal bleeding starting today. It is associated with an increase in uterine cramping.  She had a cervical polyp removed on 8/6 with small amount of bleeding afterwards, spotting only, until today when bleeding became heavier.  She has not tried any treatments, nothing makes the bleeding better or worse. There are no other associated symptoms. She denies vaginal itching/burning, urinary symptoms, h/a, dizziness, n/v, or fever/chills.     HPI  Past Medical History:  Diagnosis Date  . Cholecystitis, acute with cholelithiasis 06/21/2012  . Hypertension   . Varicose veins of both lower extremities    Past Surgical History:  Procedure Laterality Date  . ANKLE SURGERY    . CHOLECYSTECTOMY N/A 06/22/2012   Procedure: LAPAROSCOPIC CHOLECYSTECTOMY WITH ATTEMPTED CHOLANGIOGRAM;  Surgeon: Shelly Rubensteinouglas A Blackman, MD;  Location: [facility] OR;  Service: General;  Laterality: N/A;  . DILATION AND CURETTAGE OF UTERUS     Social History   Social History  . Marital status: Married    Spouse name: N/A  . Number of children: N/A  . Years of education: N/A   Occupational History  . Not on file.   Social History Main Topics  . Smoking status: Never Smoker  . Smokeless tobacco: Never Used  . Alcohol use No  . Drug use: No  . Sexual activity: Yes   Other Topics Concern  . Not on file   Social History Narrative  . No narrative on file   No current facility-administered medications on file prior to encounter.    Current Outpatient Prescriptions on File Prior to Encounter  Medication Sig Dispense Refill  . labetalol (NORMODYNE) 100 MG tablet Take 1 tablet (100 mg total) by mouth 2 (two) times daily. 60 tablet 1  . Prenatal Vit-Fe Fumarate-FA (PREPLUS) 27-1 MG TABS Take 1 tablet by mouth daily. 30 tablet  13   No Known Allergies  ROS:  Review of Systems  Constitutional: Negative for chills, fatigue and fever.  Respiratory: Negative for shortness of breath.   Cardiovascular: Negative for chest pain.  Gastrointestinal: Negative for nausea and vomiting.  Genitourinary: Positive for pelvic pain and vaginal bleeding. Negative for difficulty urinating, dysuria, flank pain, vaginal discharge and vaginal pain.  Neurological: Negative for dizziness and headaches.  Psychiatric/Behavioral: Negative.      I have reviewed patient's Past Medical Hx, Surgical Hx, Family Hx, Social Hx, medications and allergies.   Physical Exam   Patient Vitals for the past 24 hrs:  BP Temp Temp src Pulse Resp SpO2 Height Weight  12/11/16 0226 136/79 - - 67 - - - -  12/11/16 0023 (!) 150/84 98.5 F (36.9 C) Oral 71 18 99 % 5\' 3"  (1.6 m) 247 lb 4 oz (112.2 kg)   Constitutional: Well-developed, well-nourished female in no acute distress.  Cardiovascular: normal rate Respiratory: normal effort GI: Abd soft, non-tender. Pos BS x 4 MS: Extremities nontender, no edema, normal ROM Neurologic: Alert and oriented x 4.  GU: Neg CVAT.  PELVIC EXAM: Cervix pink, visually closed, without lesion, moderate amount dark red bleeding requiring 2 fox swabs to see cervix, vaginal walls and external genitalia normal  Dilation: Closed Effacement (%): 50 Exam by:: Thomas Mabry leftwich-kirby,cnm  FHT 150 by doppler  LAB RESULTS Results for orders placed or performed during the hospital encounter of 12/11/16 (  from the past 24 hour(s))  Urinalysis, Routine w reflex microscopic     Status: Abnormal   Collection Time: 12/11/16 12:25 AM  Result Value Ref Range   Color, Urine STRAW (A) YELLOW   APPearance CLEAR CLEAR   Specific Gravity, Urine 1.005 1.005 - 1.030   pH 6.0 5.0 - 8.0   Glucose, UA NEGATIVE NEGATIVE mg/dL   Hgb urine dipstick LARGE (A) NEGATIVE   Bilirubin Urine NEGATIVE NEGATIVE   Ketones, ur NEGATIVE NEGATIVE mg/dL    Protein, ur NEGATIVE NEGATIVE mg/dL   Nitrite NEGATIVE NEGATIVE   Leukocytes, UA NEGATIVE NEGATIVE   RBC / HPF 0-5 0 - 5 RBC/hpf   WBC, UA 0-5 0 - 5 WBC/hpf   Bacteria, UA RARE (A) NONE SEEN   Squamous Epithelial / LPF 0-5 (A) NONE SEEN    --/--/A POS (05/28 1942)  IMAGING No results found. Preliminary result of Limited OB US with normal FHR, fluid level, no problems with placenta.  Cervical length 4 cm.  MAU Management/MDM: Ordered labs and [place] and reviewed results. Bleeding is likely from cervical polyp removal.  Bleeding precautions reviewed, reasons to return.  Pt to call the office if bleeding persists, come to MAU if bleeding is heavy. Pt stable at time of discharge.  ASSESSMENT 1. Preexisting hypertension complicating pregnancy, antepartum   2. Supervision of high risk pregnancy, antepartum   3. Vaginal bleeding in pregnancy, second trimester   4. [redacted] weeks gestation of pregnancy   5. Chronic hypertension   6. Advanced maternal age in multigravida, second trimester     PLAN Discharge home with bleeding precautions  Allergies as of 12/11/2016   No Known Allergies     Medication List    TAKE these medications   labetalol 200 MG tablet Commonly known as:  NORMODYNE Take 1 tablet (200 mg total) by mouth 2 (two) times daily. What changed:  medication strength  how much to take   PREPLUS 27-1 MG Tabs Take 1 tablet by mouth daily.      Follow-up Information    [facility] Follow up.   Specialty:  Obstetrics and Gynecology Why:  Segn lo programado, llame a la oficina o regrese a MAU si el sangrado persiste. Acrquese a MAU por sangrado abundante u otras emergencias. Contact information: [address] [place] [address] [phone]          Sharen Counter Certified Nurse-Midwife 12/11/2016  3:10 AM

## 2016-12-11 NOTE — MAU Note (Signed)
Pt states she was here on the 6th. Pt states she had a polyp removed from her cervix that day. Pt states she has been spotting since that time. Pt states today she has been having heavier bleeding and blood clots. Pt states she has been having cramping today.

## 2016-12-11 NOTE — Progress Notes (Signed)
Pt declines Interpretor, requesting daughter to interpret.

## 2016-12-14 LAB — CYSTIC FIBROSIS DIAGNOSTIC STUDY: Interpretation-CFDNA:: NEGATIVE

## 2016-12-14 LAB — OB RESULTS CONSOLE VARICELLA ZOSTER ANTIBODY, IGG: Varicella: IMMUNE

## 2016-12-14 LAB — OB RESULTS CONSOLE RPR: RPR: NONREACTIVE

## 2016-12-14 LAB — CULTURE, OB URINE: URINE CULTURE, OB: NEGATIVE

## 2016-12-14 LAB — CYTOLOGY - PAP: PAP SMEAR: NEGATIVE

## 2016-12-14 LAB — OB RESULTS CONSOLE HIV ANTIBODY (ROUTINE TESTING): HIV: NONREACTIVE

## 2016-12-15 ENCOUNTER — Other Ambulatory Visit (HOSPITAL_COMMUNITY): Payer: Self-pay | Admitting: Nurse Practitioner

## 2016-12-15 DIAGNOSIS — Z3689 Encounter for other specified antenatal screening: Secondary | ICD-10-CM

## 2016-12-15 LAB — SICKLE CELL SCREEN: Sickle Cell Screen: NORMAL

## 2016-12-18 ENCOUNTER — Other Ambulatory Visit: Payer: Self-pay | Admitting: Advanced Practice Midwife

## 2016-12-24 ENCOUNTER — Encounter: Payer: Self-pay | Admitting: *Deleted

## 2016-12-28 ENCOUNTER — Ambulatory Visit (INDEPENDENT_AMBULATORY_CARE_PROVIDER_SITE_OTHER): Payer: Medicaid Other | Admitting: Obstetrics and Gynecology

## 2016-12-28 VITALS — BP 134/85 | HR 84 | Wt 248.6 lb

## 2016-12-28 DIAGNOSIS — Z789 Other specified health status: Secondary | ICD-10-CM

## 2016-12-28 DIAGNOSIS — O99210 Obesity complicating pregnancy, unspecified trimester: Secondary | ICD-10-CM | POA: Insufficient documentation

## 2016-12-28 DIAGNOSIS — O09522 Supervision of elderly multigravida, second trimester: Secondary | ICD-10-CM

## 2016-12-28 DIAGNOSIS — O0990 Supervision of high risk pregnancy, unspecified, unspecified trimester: Secondary | ICD-10-CM

## 2016-12-28 DIAGNOSIS — Z603 Acculturation difficulty: Secondary | ICD-10-CM | POA: Insufficient documentation

## 2016-12-28 DIAGNOSIS — O10919 Unspecified pre-existing hypertension complicating pregnancy, unspecified trimester: Secondary | ICD-10-CM

## 2016-12-28 LAB — POCT URINALYSIS DIP (DEVICE)
GLUCOSE, UA: NEGATIVE mg/dL
Hgb urine dipstick: NEGATIVE
KETONES UR: 15 mg/dL — AB
Leukocytes, UA: NEGATIVE
Nitrite: NEGATIVE
PROTEIN: 30 mg/dL — AB
Specific Gravity, Urine: 1.02 (ref 1.005–1.030)
Urobilinogen, UA: 1 mg/dL (ref 0.0–1.0)
pH: 6 (ref 5.0–8.0)

## 2016-12-28 MED ORDER — ASPIRIN EC 81 MG PO TBEC: 81.0000 mg | DELAYED_RELEASE_TABLET | Freq: Every day | ORAL | 5 refills | Status: DC

## 2016-12-28 NOTE — Patient Instructions (Signed)
Eleccin del mtodo anticonceptivo (Contraception Choices) La anticoncepcin (control de la natalidad) es el uso de cualquier mtodo o dispositivo para evitar el embarazo. A continuacin se indican algunos de esos mtodos. MTODOS HORMONALES  El Implante contraconceptivo consiste en un tubo plstico delgado que contiene la hormona progesterona. No contiene estrgenos. El mdico inserta el tubo en la parte interna del brazo. El tubo puede permanecer en el lugar durante 3 aos. Despus de los 3 aos debe retirarse. El implante impide que los ovarios liberen vulos (ovulacin), espesa el moco cervical, lo que evita que los espermatozoides ingresen al tero y hace ms delgada la membrana que cubre el interior del tero.  Inyecciones de progesterona sola: las administra el mdico cada 3 meses para evitar el embarazo. La progesterona sinttica impide que los ovarios liberen vulos. Tambin hacen que el moco cervical se espese y modifique el tejido de recubrimiento interno del tero. Esto hace ms difcil que los espermatozoides sobrevivan en el tero.  Las pldoras anticonceptivas contienen estrgenos y progesterona. Su funcin es evitar que los ovarios liberen vulos (ovulacin). Las hormonas de los anticonceptivos orales hacen que el moco cervical se haga ms espeso, lo que evita que el esperma ingrese al tero. Las pldoras anticonceptivas son recetadas por el mdico.Tambin se utilizan para tratar los perodos menstruales abundantes.  Minipldora: este tipo de pldora anticonceptiva contiene slo hormona progesterona. Deben tomarse todos los das del mes y debe recetarlas el mdico.  El parche de control de natalidad: contiene hormonas similares a las que contienen las pldoras anticonceptivas. Deben cambiarse una vez por semana y se utilizan bajo prescripcin mdica.  Anillo vaginal: contiene hormonas similares a las que contienen las pldoras anticonceptivas. Se deja colocado durante tres semanas, se  lo retira durante 1 semana y luego se coloca uno nuevo. La paciente debe sentirse cmoda al insertar y retirar el anillo de la vagina.Es necesaria la prescripcin mdica.  Anticonceptivos de emergencia: son mtodos para evitar un embarazo despus de una relacin sexual sin proteccin. Esta pldora puede tomarse inmediatamente despus de tener relaciones sexuales o hasta 5 das de haber tenido sexo sin proteccin. Es ms efectiva si se toma poco tiempo despus de la relacin sexual. Los anticonceptivos de emergencia estn disponibles sin prescripcin mdica. Consltelo con su farmacutico. No use los anticonceptivos de emergencia como nico mtodo anticonceptivo.  MTODOS DE BARRERA  Condn masculino: es una vaina delgada (ltex o goma) que se coloca cubriendo al pene durante el acto sexual. Puede usarse con espermicida para aumentar la efectividad.  Condn femenino. Es una funda delicada y blanda que se adapta holgadamente a la vagina antes de las relaciones sexuales.  Diafragma: es una barrera de ltex redonda y suave que debe ser recomendado por un profesional. Se inserta en la vagina, junto con un gel espermicida. Debe insertarse antes de tener relaciones sexuales. Debe dejar el diafragma colocado en la vagina durante 6 a 8 horas despus de la relacin sexual.  Capuchn cervical: es una barrera de ltex o taza plstica redonda y suave que cubre el cuello del tero y debe ser colocada por un mdico. Puede dejarlo colocado en la vagina hasta 48 horas despus de las relaciones sexuales.  Esponja: es una pieza blanda y circular de espuma de poliuretano. Contiene un espermicida. Se inserta en la vagina despus de mojarla y antes de las relaciones sexuales.  Espermicidas: son sustancias qumicas que matan o bloquean al esperma y no lo dejan ingresar al cuello del tero y al tero. Vienen en   forma de cremas, geles, supositorios, espuma o comprimidos. No es necesario tener receta mdica. Se insertan en  la vagina con un aplicador antes de tener relaciones sexuales. El proceso debe repetirse cada vez que tiene relaciones sexuales.  ANTICONCEPTIVOS INTRAUTERINOS  Dispositivo intrauterino (DIU) es un dispositivo en forma de T que se coloca en el tero durante el perodo menstrual, para evitar el embarazo. Hay dos tipos: ? DIU de cobre: este tipo de DIU est recubierto con un alambre de cobre y se inserta dentro del tero. El cobre hace que el tero y las trompas de Falopio produzcan un liquido que destruye los espermatozoides. Puede permanecer colocado durante 10 aos. ? DIU con hormona: este tipo de DIU contiene la hormona progestina (progesterona sinttica). La hormona espesa el moco cervical y evita que los espermatozoides ingresen al tero y tambin afina la membrana que cubre el tero para evitar la implantacin del vulo fertilizado. La hormona debilita o destruye los espermatozoides que ingresan al tero. Puede permanecer en el lugar durante 3-5 aos, segn el tipo de DIU que se utilice.  MTODOS ANTICONCEPTIVOS PERMANENTES  Ligadura de trompas en la mujer: se realiza sellando, atando u obstruyendo quirrgicamente las trompas de Falopio lo que impide que el vulo descienda hacia el tero.  Esterilizacin histeroscpica: Implica la colocacin de un pequeo espiral o la insercin en cada trompa de Falopio. El mdico utiliza una tcnica llamada histeroscopa para realizar este procedimiento. El dispositivo produce la formacin de tejido cicatrizal. Esto da como resultado una obstruccin permanente de las trompas de Falopio, de modo que la esperma no pueda fertilizar el vulo. Demora alrededor de 3 meses despus del procedimiento hasta que el conducto se obstruye. Tendr que usar otro mtodo anticonceptivo durante al menos 3 meses.  Esterilizacin masculina: se realiza ligando los conductos por los que pasan los espermatozoides (vasectoma).Esto impide que el esperma ingrese a la vagina durante el  acto sexual. Luego del procedimiento, el hombre puede eyacular lquido (semen).  MTODOS DE PLANIFICACIN NATURAL  Planificacin familiar natural: consiste en no tener relaciones sexuales o usar un mtodo de barrera (condn, diafragma, capuchn cervical) en los das que la mujer podra quedar embarazada.  Mtodo de calendario: consiste en el seguimiento de la duracin de cada ciclo menstrual y la identificacin de los perodos frtiles.  Mtodo de ovulacin: consiste en evitar las relaciones sexuales durante la ovulacin.  Mtodo sintotrmico: consiste en evitar las relaciones sexuales en la poca en la que se est ovulando, utilizando un termmetro y tendiendo en cuenta los sntomas de la ovulacin.  Mtodo postovulacin: consiste en planificar las relaciones sexuales para despus de haber ovulado. Independientemente del tipo o mtodo anticonceptivo que usted elija, es importante que use condones para protegerse contra las infecciones de transmisin sexual (ETS). Hable con su mdico con respecto a qu mtodo anticonceptivo es el ms apropiado para usted. Esta informacin no tiene como fin reemplazar el consejo del mdico. Asegrese de hacerle al mdico cualquier pregunta que tenga. Document Released: 04/20/2005 Document Revised: 12/21/2012 Document Reviewed: 10/13/2012 Elsevier Interactive Patient Education  2017 Elsevier Inc.  

## 2016-12-28 NOTE — Progress Notes (Signed)
Subjective:    Christina Weiss is a 45 y.o. [phone] [redacted]w[redacted]d being seen today for her obstetrical visit.  Patient reports no complaints. Fetal movement: normal. Patient transferring care from HD due to [facility] on labetalol. Patient plans to breastfeed and is undecided on contraception  Objective:    BP 134/85   Pulse 84   Wt 248 lb 9.6 oz (112.8 kg)   LMP 08/17/2016   BMI 44.04 kg/m   Physical Exam  Exam  FHT: Fetal Heart Rate (bpm): 155  Uterine Size:    Presentation:       Assessment:    Pregnancy:  [id number]    Plan:    Patient Active Problem List   Diagnosis Date Noted  . Maternal obesity, antepartum 12/28/2016  . Preexisting hypertension complicating pregnancy, antepartum 12/07/2016  . AMA (advanced maternal age) multigravida 35+ 12/07/2016  . Supervision of high risk pregnancy, antepartum 12/07/2016    Discussed weight gain goal during pregnancy given her matertnal obesity status. Reviewed healthy eating habits  Anatomy ultrasound and genetic counseling scheduled on 01/11/2017 Rx ASA provided to start daily Follow up in 4 weeks.

## 2016-12-29 ENCOUNTER — Telehealth: Payer: Self-pay | Admitting: Family Medicine

## 2016-12-29 ENCOUNTER — Other Ambulatory Visit: Payer: Self-pay

## 2016-12-29 LAB — COMPREHENSIVE METABOLIC PANEL
A/G RATIO: 1.3 (ref 1.2–2.2)
ALBUMIN: 4 g/dL (ref 3.5–5.5)
ALK PHOS: 55 IU/L (ref 39–117)
ALT: 17 IU/L (ref 0–32)
AST: 17 IU/L (ref 0–40)
BUN / CREAT RATIO: 13 (ref 9–23)
BUN: 6 mg/dL (ref 6–24)
Bilirubin Total: 0.4 mg/dL (ref 0.0–1.2)
CALCIUM: 9 mg/dL (ref 8.7–10.2)
CO2: 19 mmol/L — ABNORMAL LOW (ref 20–29)
Chloride: 102 mmol/L (ref 96–106)
Creatinine, Ser: 0.48 mg/dL — ABNORMAL LOW (ref 0.57–1.00)
GFR calc Af Amer: 139 mL/min/{1.73_m2} (ref >59)
GFR, EST NON AFRICAN AMERICAN: 121 mL/min/{1.73_m2} (ref >59)
GLOBULIN, TOTAL: 3 g/dL (ref 1.5–4.5)
Glucose: 98 mg/dL (ref 65–99)
POTASSIUM: 4 mmol/L (ref 3.5–5.2)
SODIUM: 137 mmol/L (ref 134–144)
Total Protein: 7 g/dL (ref 6.0–8.5)

## 2016-12-29 LAB — PROTEIN / CREATININE RATIO, URINE
Creatinine, Urine: 224 mg/dL
PROTEIN/CREAT RATIO: 127 mg/g{creat} (ref 0–200)
Protein, Ur: 28.4 mg/dL

## 2016-12-29 LAB — CBC
Hematocrit: 31.8 % — ABNORMAL LOW (ref 34.0–46.6)
Hemoglobin: 10.5 g/dL — ABNORMAL LOW (ref 11.1–15.9)
MCH: 27.7 pg (ref 26.6–33.0)
MCHC: 33 g/dL (ref 31.5–35.7)
MCV: 84 fL (ref 79–97)
PLATELETS: 175 10*3/uL (ref 150–379)
RBC: 3.79 x10E6/uL (ref 3.77–5.28)
RDW: 15.4 % (ref 12.3–15.4)
WBC: 5.1 10*3/uL (ref 3.4–10.8)

## 2016-12-29 MED ORDER — LABETALOL HCL 100 MG PO TABS: 100.0000 mg | ORAL_TABLET | Freq: Two times a day (BID) | ORAL | 1 refills | Status: DC

## 2016-12-29 NOTE — Telephone Encounter (Signed)
Patient called about her Rx not getting filled for her high blood pressure. Informed patient we were closing for lunch, but would have nurse call after lunch.

## 2017-01-01 ENCOUNTER — Encounter: Payer: Self-pay | Admitting: *Deleted

## 2017-01-09 ENCOUNTER — Inpatient Hospital Stay (HOSPITAL_COMMUNITY)
Admission: AD | Admit: 2017-01-09 | Discharge: 2017-01-09 | Disposition: A | Discharge status: Home / Self Care | Payer: Medicaid Other | Source: Ambulatory Visit | Attending: Obstetrics & Gynecology | Admitting: Obstetrics & Gynecology

## 2017-01-09 ENCOUNTER — Encounter (HOSPITAL_COMMUNITY): Payer: Self-pay | Admitting: *Deleted

## 2017-01-09 ENCOUNTER — Inpatient Hospital Stay (HOSPITAL_COMMUNITY): Payer: Medicaid Other

## 2017-01-09 DIAGNOSIS — Z7982 Long term (current) use of aspirin: Secondary | ICD-10-CM | POA: Insufficient documentation

## 2017-01-09 DIAGNOSIS — Z3A19 19 weeks gestation of pregnancy: Secondary | ICD-10-CM

## 2017-01-09 DIAGNOSIS — O4692 Antepartum hemorrhage, unspecified, second trimester: Secondary | ICD-10-CM

## 2017-01-09 DIAGNOSIS — O4690 Antepartum hemorrhage, unspecified, unspecified trimester: Secondary | ICD-10-CM

## 2017-01-09 LAB — URINALYSIS, ROUTINE W REFLEX MICROSCOPIC
BILIRUBIN URINE: NEGATIVE
Glucose, UA: NEGATIVE mg/dL
KETONES UR: NEGATIVE mg/dL
LEUKOCYTES UA: NEGATIVE
NITRITE: NEGATIVE
Protein, ur: NEGATIVE mg/dL
SPECIFIC GRAVITY, URINE: 1.004 — AB (ref 1.005–1.030)
pH: 7 (ref 5.0–8.0)

## 2017-01-09 LAB — WET PREP, GENITAL
Clue Cells Wet Prep HPF POC: NONE SEEN
SPERM: NONE SEEN
TRICH WET PREP: NONE SEEN
YEAST WET PREP: NONE SEEN

## 2017-01-09 MED ORDER — IBUPROFEN 600 MG PO TABS
600.0000 mg | ORAL_TABLET | Freq: Once | ORAL | Status: AC
  Administered 2017-01-09: 600 mg via ORAL
  Filled 2017-01-09: qty 1

## 2017-01-09 NOTE — MAU Note (Signed)
Having excessive vag bleeding for last with cramping. Was sitting down and felt wet and blood was on chair. Wore pad into hospital. Has had bleeding several times this pregnancy

## 2017-01-09 NOTE — Progress Notes (Signed)
Pt's daughter present and wants daughter to interpret. [name], in-house spanish interpreter called and verified this with pt. Waiver signed in Triage and pt's daughter, Christina Weiss , will interpret for pt

## 2017-01-09 NOTE — MAU Provider Note (Signed)
History     CSN: [id number]  Arrival date and time: 01/09/17 [id number]   First Provider Initiated Contact with Patient 01/09/17 2038      Chief Complaint  Patient presents with  . Vaginal Bleeding   HPI   [name] is a 46 y.o. female [phone] @ 5675w0d here in MAU with vaginal bleeding. The bleeding started today around 40 minutes prior to arrival. No recent intercourse, + constipation. States she has had visits to the MAU due to vaginal bleeding and was found to have a polyp. That was removed. She has had several episodes of bleeding throughout this pregnancy. + mild, lower abdominal cramping.   OB History    Gravida Para Term Preterm AB Living   5 3 3  0 1 3   SAB TAB Ectopic Multiple Live Births   0 1 0 0 3      Past Medical History:  Diagnosis Date  . Cholecystitis, acute with cholelithiasis 06/21/2012  . Hypertension   . Varicose veins of both lower extremities     Past Surgical History:  Procedure Laterality Date  . ANKLE SURGERY    . CHOLECYSTECTOMY N/A 06/22/2012   Procedure: LAPAROSCOPIC CHOLECYSTECTOMY WITH ATTEMPTED CHOLANGIOGRAM;  Surgeon: Shelly Rubensteinouglas A Blackman, MD;  Location: [facility] OR;  Service: General;  Laterality: N/A;  . DILATION AND CURETTAGE OF UTERUS    . ENDOSCOPIC VEIN LASER TREATMENT      History reviewed. No pertinent family history.  Social History  Substance Use Topics  . Smoking status: Never Smoker  . Smokeless tobacco: Never Used  . Alcohol use No    Allergies: No Known Allergies  Prescriptions Prior to Admission  Medication Sig Dispense Refill Last Dose  . aspirin EC 81 MG tablet Take 1 tablet (81 mg total) by mouth daily. 30 tablet 5   . labetalol (NORMODYNE) 100 MG tablet Take 1 tablet (100 mg total) by mouth 2 (two) times daily. 60 tablet 1   . Prenatal Vit-Fe Fumarate-FA (PREPLUS) 27-1 MG TABS Take 1 tablet by mouth daily. 30 tablet 13 Taking   Results for orders placed or performed during the hospital encounter of 01/09/17 (from  the past 48 hour(s))  Urinalysis, Routine w reflex microscopic     Status: Abnormal   Collection Time: 01/09/17  8:20 PM  Result Value Ref Range   Color, Urine YELLOW YELLOW   APPearance CLEAR CLEAR   Specific Gravity, Urine 1.004 (L) 1.005 - 1.030   pH 7.0 5.0 - 8.0   Glucose, UA NEGATIVE NEGATIVE mg/dL   Hgb urine dipstick LARGE (A) NEGATIVE   Bilirubin Urine NEGATIVE NEGATIVE   Ketones, ur NEGATIVE NEGATIVE mg/dL   Protein, ur NEGATIVE NEGATIVE mg/dL   Nitrite NEGATIVE NEGATIVE   Leukocytes, UA NEGATIVE NEGATIVE   RBC / HPF 0-5 0 - 5 RBC/hpf   WBC, UA 0-5 0 - 5 WBC/hpf   Bacteria, UA RARE (A) NONE SEEN   Squamous Epithelial / LPF 0-5 (A) NONE SEEN  Wet prep, genital     Status: Abnormal   Collection Time: 01/09/17  8:45 PM  Result Value Ref Range   Yeast Wet Prep HPF POC NONE SEEN NONE SEEN   Trich, Wet Prep NONE SEEN NONE SEEN   Clue Cells Wet Prep HPF POC NONE SEEN NONE SEEN   WBC, Wet Prep HPF POC FEW (A) NONE SEEN    Comment: FEW BACTERIA SEEN   Sperm NONE SEEN     Review of Systems  Constitutional: Negative  for fever.  Gastrointestinal: Positive for abdominal pain. Negative for diarrhea, nausea and vomiting.  Genitourinary: Positive for vaginal bleeding.   Physical Exam   Blood pressure 137/78, pulse 78, temperature 98.2 F (36.8 C), resp. rate 18, height  (1.626 m), weight 114.3 kg (252 lb), last menstrual period 08/17/2016.  Physical Exam  Constitutional: She is oriented to person, place, and time. She appears well-developed and well-nourished. No distress.  HENT:  Head: Normocephalic.  Respiratory: Effort normal.  GI: Soft. She exhibits no distension. There is no tenderness. There is no rebound and no guarding.  Genitourinary:  Genitourinary Comments: Vagina - Small amount of dark red blood in the vaginal canal.  Cervix - + scant active bleeding  Bimanual exam: Cervix closed, posterior  wet prep done Chaperone present for exam.   Musculoskeletal:  Normal range of motion.  Neurological: She is alert and oriented to person, place, and time.  Skin: Skin is warm. She is not diaphoretic.  Psychiatric: Her behavior is normal.    MAU Course  Procedures  None  MDM  + fetal heart tones via doppler  A positive blood type [place] limited  Cervical length >3 cm No acute findings Ibuprofen 600 mg given PO   Assessment and Plan   A:  1. [redacted] weeks gestation of pregnancy   2. Vaginal bleeding during pregnancy     P:  Discharge home in stable condition Strict return precautions Pelvic rest Bleeding precautions  Follow up with ob as scheduled  Venia Carbon I, NP 01/09/2017 11:12 PM

## 2017-01-09 NOTE — Discharge Instructions (Signed)
Reposo pélvico °(Pelvic Rest) °¿CUÁNDO SE RECOMIENDA EL REPOSO PÉLVICO? °El reposo pélvico puede recomendarse en los siguientes casos: °· La placenta cubre de forma parcial o total la abertura del cuello del útero (placenta previa). °· Hay sangrado entre la pared del útero y el saco amniótico en el primer trimestre de embarazo (hemorragia subcoriónica). °· El trabajo de parto comienza muy pronto (trabajo de parto prematuro). °Según la salud general de la madre y el feto, el médico decidirá si el reposo pélvico es adecuado. °¿CÓMO HAGO REPOSO PÉLVICO? °Durante el tiempo que le indique el médico: °· No tenga relaciones sexuales, estimulación sexual ni orgasmos. °· No use tampones. No se haga duchas vaginales. No se introduzca nada en la vagina. °· No levante ningún objeto que pese más de 10 libras (4,5 kg). °· Evite las actividades que demanden mucho esfuerzo (extenuantes). °· Evite las actividades que requieran esfuerzos de los músculos de la pelvis. °¿CUÁNDO DEBO BUSCAR ATENCIÓN MÉDICA? °Solicite atención médica de inmediato si: °· Tiene cólicos en la zona inferior del abdomen. °· Tiene secreción de flujo vaginal. °· Tiene un dolor sordo en la parte baja de la espalda. °· Tiene contracciones regulares. °· Tienen tensión uterina. °¿CUÁNDO DEBO BUSCAR ASISTENCIA MÉDICA INMEDIATA? °Solicite atención médica de inmediato si: °· Tiene sangrado vaginal y está embarazada. °Esta información no tiene como fin reemplazar el consejo del médico. Asegúrese de hacerle al médico cualquier pregunta que tenga. °Document Released: 01/13/2012 Document Revised: 08/12/2015 Document Reviewed: 10/22/2014 °Elsevier Interactive Patient Education © 2018 Elsevier Inc. ° °

## 2017-01-11 ENCOUNTER — Ambulatory Visit (HOSPITAL_COMMUNITY)
Admission: RE | Admit: 2017-01-11 | Discharge: 2017-01-11 | Disposition: A | Discharge status: Home / Self Care | Payer: Medicaid Other | Source: Ambulatory Visit | Attending: Nurse Practitioner | Admitting: Nurse Practitioner

## 2017-01-11 ENCOUNTER — Ambulatory Visit (HOSPITAL_COMMUNITY): Admission: RE | Admit: 2017-01-11 | Payer: Medicaid Other | Source: Ambulatory Visit

## 2017-01-12 ENCOUNTER — Other Ambulatory Visit: Payer: Self-pay

## 2017-01-13 ENCOUNTER — Ambulatory Visit (HOSPITAL_COMMUNITY)
Admission: RE | Admit: 2017-01-13 | Discharge: 2017-01-13 | Disposition: A | Discharge status: Home / Self Care | Payer: Medicaid Other | Source: Ambulatory Visit | Attending: Nurse Practitioner | Admitting: Nurse Practitioner

## 2017-01-13 ENCOUNTER — Encounter (HOSPITAL_COMMUNITY): Payer: Self-pay

## 2017-01-13 ENCOUNTER — Other Ambulatory Visit (HOSPITAL_COMMUNITY): Payer: Self-pay | Admitting: Nurse Practitioner

## 2017-01-13 DIAGNOSIS — Z3A19 19 weeks gestation of pregnancy: Secondary | ICD-10-CM | POA: Diagnosis not present

## 2017-01-13 DIAGNOSIS — Z3689 Encounter for other specified antenatal screening: Secondary | ICD-10-CM | POA: Diagnosis not present

## 2017-01-13 DIAGNOSIS — Z3A21 21 weeks gestation of pregnancy: Secondary | ICD-10-CM

## 2017-01-13 DIAGNOSIS — O09522 Supervision of elderly multigravida, second trimester: Secondary | ICD-10-CM

## 2017-01-13 DIAGNOSIS — O09892 Supervision of other high risk pregnancies, second trimester: Secondary | ICD-10-CM | POA: Insufficient documentation

## 2017-01-13 DIAGNOSIS — O10012 Pre-existing essential hypertension complicating pregnancy, second trimester: Secondary | ICD-10-CM | POA: Diagnosis not present

## 2017-01-13 DIAGNOSIS — O10919 Unspecified pre-existing hypertension complicating pregnancy, unspecified trimester: Secondary | ICD-10-CM

## 2017-01-13 DIAGNOSIS — O99212 Obesity complicating pregnancy, second trimester: Secondary | ICD-10-CM | POA: Diagnosis present

## 2017-01-13 NOTE — Progress Notes (Signed)
Appointment Date: 01/13/2017 DOB: 02-13-1971 Referring Provider: Currie Paris, NP Attending: Dr. Particia Nearing  Christina Weiss was seen for genetic counseling because of a maternal age of 46 y.o..   In person Spanish/English translation was provided.  In summary:  Discussed maternal age and associated risks for fetal aneuploidy  Reviewed normal results of Quad screen  Reduction in risk for Down syndrome and Trisomy 18  Not screening for other fetal aneuploidies  Reviewed results of ultrasound  No fetal anomalies or markers seen  Reduction in risk for fetal aneuploidy  Offered additional testing and screening  NIPS (Panorama) drawn today  Declined amniocentesis  Reviewed family history concerns  Advanced paternal age  FOB's grandchildren died at young ages - etiology unknown  Discussed carrier screening options  CF - already performed  SMA - declined  Hemoglobinopathies - already performed  She was counseled regarding maternal age and the association with risk for chromosome conditions due to nondisjunction with aging of the ova.   We reviewed chromosomes, nondisjunction, and the associated 1 in 22 risk for fetal aneuploidy related to a maternal age of 46 y.o. at [redacted]w[redacted]d gestation.  She was counseled that the risk for aneuploidy decreases as gestational age increases, accounting for those pregnancies which spontaneously abort.  We specifically discussed Down syndrome (trisomy 19), trisomies 42 and 69, and sex chromosome aneuploidies (47,XXX and 47,XXY) including the common features and prognoses of each.   We discussed that the Quad screen is able to adjust a person's baseline (age related) chance for specific chromosome conditions.  Based on this test, her chance for Down syndrome was reduced from her age related chance of 1 in 20 to 1 in 39; the Quad screen also reduced the chance for Trisomy 18 from 1 in 17 to 1 in 62.   She understands that Quad screen  can not adjust her age related chance for sex chromosome aneuploidies or Trisomy 50.  She was counseled that 50-80% of fetuses with Down syndrome and up to 90% of fetuses with trisomies 13 and 18, when well visualized, have detectable anomalies or soft markers by ultrasound. A complete ultrasound was performed today. The ultrasound report will be sent under separate cover. There were no visualized fetal anomalies or markers suggestive of aneuploidy  We also discussed the option of noninvasive prenatal screening (NIPS)/cell free DNA (cfDNA) screening.  She was counseled that this screening test can provide a pregnancy specific risk assessment. We reviewed the benefits and limitations of this screening option. Specifically, we discussed the conditions for which the test screens, the detection rates, and false positive rates for each. She was also counseled regarding diagnostic testing via amniocentesis. We reviewed the approximate 1 in 300-500 risk for complications for amniocentesis, including spontaneous pregnancy loss. We discussed the possible results that the tests might provide including: positive, negative, unanticipated, and no result. Finally, she was counseled regarding the cost of each option and potential out of pocket expenses.  After consideration of all the options, she elected to proceed with NIPS.  Those results will be available in 8-10 days.    Christina Weiss was provided with written information regarding cystic fibrosis (CF), spinal muscular atrophy (SMA) and hemoglobinopathies including the carrier frequency, availability of carrier screening and prenatal diagnosis if indicated.  In addition, we discussed that CF and hemoglobinopathies are routinely screened for as part of the [place] newborn screening panel.  Christina Weiss has previously had normal screening for hemoglobin disorders and for CF,  she declined screening for SMA.  Both family histories were reviewed.  Christina Weiss reported  that her husband has a daughter from a previous relationship; that daughter had six children, three are still living.  One child died at age 44, another at age 275 and a third at age 501.  Christina Weiss was not aware of a specific diagnosis for any of these children, and reports different clinical pictures for each child.  Christina Weiss's other 4 children all have healthy children.  Christina Weiss's husband is 44 years of age.   We discussed that advanced paternal age is defined as paternal age greater than or equal to age 44.  Recent large-scale sequencing studies have shown that approximately 80% of de novo point mutations are of paternal origin.  Many studies have demonstrated a strong correlation between increased paternal age and de novo point mutations.  It is estimated that the overall chance for a de novo mutation is ~0.5%.  We also discussed the wide range of conditions which can be caused by new dominant gene mutations (achondroplasia, neurofibromatosis, Marfan syndrome etc.).  She was counseled that genetic testing for each individual single gene condition is not warranted or available unless ultrasound or concerns lend suspicion to a specific condition.    She was also counseled that some literature suggests that APA is also associated with an increase in risk for fetal aneuploidy.  While other literature does not support this, we discussed that a specific risk for aneuploidy other than that based on maternal age cannot be quantified. Lastly, we discussed that newer literature suggests that the risk for autism spectrum disorders (ASD) may be increased in children born to fathers of APA.  We discussed that ASDs are among the most common neurodevelopmental disorders, with approximately 1 in 85 children meeting criteria for ASD.  Approximately 80% of individuals diagnosed are female.  There is strong evidence that genetic factors play a critical role in development of ASD.  While there have been recent  advances in identifying specific genetic causes of ASD, there are still many individuals for whom the etiology of the ASD is not known.  She understands that at this time there is no reliable, comprehensive genetic testing available for ASD and a specific paternal age related risk can not be quantified.    The remainder of the family histories were reviewed and found to be noncontributory for birth defects, intellectual disability, and known genetic conditions. Without further information regarding the provided family history, an accurate genetic risk cannot be calculated. Further genetic counseling is warranted if more information is obtained.  Christina Weiss denied exposure to environmental toxins or chemical agents. She denied the use of alcohol, tobacco or street drugs. She denied significant viral illnesses during the course of her pregnancy. Her medical and surgical histories were noncontributory.   I counseled Christina Weiss regarding the above risks and available options.  The approximate face-to-face time with the genetic counselor was 55 minutes.  Christina Gemmaaragh Orvilla Truett, MS,  Certified Genetic Counselor

## 2017-01-18 ENCOUNTER — Ambulatory Visit (INDEPENDENT_AMBULATORY_CARE_PROVIDER_SITE_OTHER): Payer: Medicaid Other | Admitting: Obstetrics & Gynecology

## 2017-01-18 VITALS — BP 125/69 | HR 75 | Wt 250.9 lb

## 2017-01-18 DIAGNOSIS — O10919 Unspecified pre-existing hypertension complicating pregnancy, unspecified trimester: Secondary | ICD-10-CM

## 2017-01-18 NOTE — Progress Notes (Signed)
   PRENATAL VISIT NOTE  Subjective:  Christina Weiss is a 46 y.o. [phone] at [redacted]w[redacted]d being seen today for ongoing prenatal care.  She is currently monitored for the following issues for this high-risk pregnancy and has Preexisting hypertension complicating pregnancy, antepartum; Advanced maternal age in multigravida, second trimester; Supervision of high risk pregnancy, antepartum; Maternal obesity, antepartum; and Language barrier on her problem list.  Patient reports no complaints.  Contractions: Not present.  .  Movement: Present. Denies leaking of fluid.   The following portions of the patient's history were reviewed and updated as appropriate: allergies, current medications, past family history, past medical history, past social history, past surgical history and problem list. Problem list updated.  Objective:   Vitals:   01/18/17 1022  BP: 125/69  Pulse: 75  Weight: 250 lb 14.4 oz (113.8 kg)    Fetal Status: Fetal Heart Rate (bpm): 150   Movement: Present     General:  Alert, oriented and cooperative. Patient is in no acute distress.  Skin: Skin is warm and dry. No rash noted.   Cardiovascular: Normal heart rate noted  Respiratory: Normal respiratory effort, no problems with respiration noted  Abdomen: Soft, gravid, appropriate for gestational age.  Pain/Pressure: Present     Pelvic: Cervical exam deferred        Extremities: Normal range of motion.     Mental Status:  Normal mood and affect. Normal behavior. Normal judgment and thought content.   Assessment and Plan:  Pregnancy: [id number] at [redacted]w[redacted]d  1. Preexisting hypertension complicating pregnancy, antepartum Continue present medication   Preterm labor symptoms and general obstetric precautions including but not limited to vaginal bleeding, contractions, leaking of fluid and fetal movement were reviewed in detail with the patient. Please refer to After Visit Summary for other counseling recommendations.  Return in about 4  weeks (around 02/15/2017).   Scheryl Darter, MD

## 2017-01-18 NOTE — Patient Instructions (Signed)

## 2017-01-19 ENCOUNTER — Other Ambulatory Visit: Payer: Self-pay

## 2017-01-20 ENCOUNTER — Telehealth (HOSPITAL_COMMUNITY): Payer: Self-pay | Admitting: Genetics

## 2017-01-20 NOTE — Telephone Encounter (Signed)
Called [facility] to discuss her prenatal cell free DNA test results. [address] Interpreter Onalee Hua [phone]) assisted with Spanish/English translation. Mrs. Christina Weiss had Panorama screening through [facility].  Screening was offered because of AMA.   The patient was identified by name and DOB.  We reviewed that these are within normal limits, showing a less than 1 in 10,000 risk for trisomies 21, 18 and 13, and monosomy X (Turner syndrome).  In addition, the risk for triploidy and sex chromosome trisomies (47,XXX and 47,XXY) was also low risk.  We reviewed that this testing identifies > 99% of pregnancies with trisomy 68, trisomy 41, sex chromosome trisomies (47,XXX and 47,XXY), and triploidy. The detection rate for trisomy 18 is 96%.  The detection rate for monosomy X is ~92%.  The false positive rate is <0.1% for all conditions. Testing was also consistent with female fetal sex.   She understands that this testing does not identify all genetic conditions.  All questions were answered to her satisfaction, she was encouraged to call with additional questions or concerns.  Mady Gemma, MS Certified Genetic Counselor

## 2017-01-29 ENCOUNTER — Other Ambulatory Visit: Payer: Self-pay

## 2017-02-11 ENCOUNTER — Ambulatory Visit (INDEPENDENT_AMBULATORY_CARE_PROVIDER_SITE_OTHER): Payer: Medicaid Other | Admitting: Obstetrics and Gynecology

## 2017-02-11 VITALS — BP 128/70 | HR 71 | Wt 251.8 lb

## 2017-02-11 DIAGNOSIS — O10919 Unspecified pre-existing hypertension complicating pregnancy, unspecified trimester: Secondary | ICD-10-CM

## 2017-02-11 DIAGNOSIS — O0992 Supervision of high risk pregnancy, unspecified, second trimester: Secondary | ICD-10-CM | POA: Diagnosis not present

## 2017-02-11 DIAGNOSIS — Z23 Encounter for immunization: Secondary | ICD-10-CM | POA: Diagnosis not present

## 2017-02-11 DIAGNOSIS — O10912 Unspecified pre-existing hypertension complicating pregnancy, second trimester: Secondary | ICD-10-CM | POA: Diagnosis present

## 2017-02-11 DIAGNOSIS — O09522 Supervision of elderly multigravida, second trimester: Secondary | ICD-10-CM | POA: Diagnosis not present

## 2017-02-11 DIAGNOSIS — O0990 Supervision of high risk pregnancy, unspecified, unspecified trimester: Secondary | ICD-10-CM

## 2017-02-11 LAB — POCT URINALYSIS DIP (DEVICE)
Bilirubin Urine: NEGATIVE
Glucose, UA: NEGATIVE mg/dL
HGB URINE DIPSTICK: NEGATIVE
Ketones, ur: NEGATIVE mg/dL
Leukocytes, UA: NEGATIVE
NITRITE: NEGATIVE
PH: 6.5 (ref 5.0–8.0)
PROTEIN: NEGATIVE mg/dL
Specific Gravity, Urine: 1.015 (ref 1.005–1.030)
UROBILINOGEN UA: 1 mg/dL (ref 0.0–1.0)

## 2017-02-11 MED ORDER — LABETALOL HCL 100 MG PO TABS: 100.0000 mg | ORAL_TABLET | Freq: Two times a day (BID) | ORAL | 1 refills | Status: DC

## 2017-02-11 NOTE — Progress Notes (Signed)
   PRENATAL VISIT NOTE  Subjective:  Christina Weiss is a 46 y.o. [phone] at [redacted]w[redacted]d being seen today for ongoing prenatal care.  She is currently monitored for the following issues for this high-risk pregnancy and has Preexisting hypertension complicating pregnancy, antepartum; Advanced maternal age in multigravida, second trimester; Supervision of high risk pregnancy, antepartum; Maternal obesity, antepartum; and Language barrier on her problem list.  Patient reports heartburn.  Contractions: Not present. Vag. Bleeding: None.  Movement: Present. Denies leaking of fluid.   The following portions of the patient's history were reviewed and updated as appropriate: allergies, current medications, past family history, past medical history, past social history, past surgical history and problem list. Problem list updated.  Objective:   Vitals:   02/11/17 0800  BP: 128/70  Pulse: 71  Weight: 251 lb 12.8 oz (114.2 kg)    Fetal Status: Fetal Heart Rate (bpm): 137   Movement: Present     General:  Alert, oriented and cooperative. Patient is in no acute distress.  Skin: Skin is warm and dry. No rash noted.   Cardiovascular: Normal heart rate noted  Respiratory: Normal respiratory effort, no problems with respiration noted  Abdomen: Soft, gravid, appropriate for gestational age.  Pain/Pressure: Present     Pelvic: Cervical exam deferred        Extremities: Normal range of motion.  Edema: Trace  Mental Status:  Normal mood and affect. Normal behavior. Normal judgment and thought content.   Assessment and Plan:  Pregnancy: [id number] at [redacted]w[redacted]d  1. Preexisting hypertension complicating pregnancy, antepartum BP well controlled on current dose - labetalol (NORMODYNE) 100 MG tablet; Take 1 tablet (100 mg total) by mouth 2 (two) times daily.  Dispense: 60 tablet; Refill: 1  2. Advanced maternal age in multigravida, second trimester Panorama & anatomy wnl  3. Supervision of high risk pregnancy,  antepartum On ASA Flu shot today  4. Heartburn zantac  Preterm labor symptoms and general obstetric precautions including but not limited to vaginal bleeding, contractions, leaking of fluid and fetal movement were reviewed in detail with the patient. Please refer to After Visit Summary for other counseling recommendations.  Return in about 3 weeks (around 03/04/2017) for OB visit.   Conan Bowens, MD

## 2017-02-11 NOTE — Patient Instructions (Addendum)
AREA PEDIATRIC/FAMILY PRACTICE PHYSICIANS  [facility] [address] [place], [place]  [address] Phone - [phone]   Fax - [phone]  [facility] [address] [place], [place] [address] Phone - [phone]   Fax - [phone]  J[facility] AMOS [address] [place], [place]  [address] Phone - [phone]   Fax - [phone]  [facility] [address] [place], [place]  [address] Phone - [phone]   Fax - [phone]  [facility] [address] [place], [place]  [address] Phone - [phone]   Fax - [phone]  [facility] [address] [place], [place]  [address] Phone - [phone]   Fax - [phone]  [facility] [place] [address] [place], [place]  [address] Phone - [phone]   Fax - [phone]  [facility] AT [facility] [address] [place], [address] [place], [place]  [address] Phone - [phone]   Fax - [phone]  [facility] AT [facility] [address] [place], [place]  [address] Phone - [phone]   Fax - [phone] [facility] [place] [address] [place], [place]  [address] Phone - [phone]   Fax - [phone]  [facility] [address] [place], [place]  [address] Phone - [phone]   Fax - [phone]  [facility] [address] [place], [place]  [address] Phone - [phone]   Fax - [phone]  [facility] [place] [address] [place], [place]  [address] Phone - [phone]   Fax - [phone]  [facility] [address], [address] [place], [place]  [address] Phone - [phone]  [facility] [address] [place], [place]  [address] Phone - [phone]   Fax - [phone]  [facility] [address] [place], [place]  [address] Phone - [phone]   Fax - [phone]  [facility] [address] [place], [place]  [address] Phone - [phone]   Fax - [phone]  [facility] [address] [place], [place]  [address] Phone - [phone]   Fax - [phone]   [facility] [address] [place], [place]  [address] Phone - [phone]   Fax - [phone]  [facility] [address] [place], [place]  [address] Phone - [phone]   Fax - [phone]  DAVID RUBIN [address] [place], [place]  [address] Phone - [phone]   Fax - [phone]  [facility] [address] [place], [place]  [address] Phone - [phone]   Fax - [phone]  [facility] Alita Chyle [address] [place], [place]  [address] Phone - [phone]   Fax - [phone] Gerarda Fraction [address] [place], [place]  [address] Phone - [phone]   Fax - [phone]  [facility] [address] [place], [place]  [address] Phone - [phone]   Fax - [phone]  [facility] - [place] [address] [place], [place]  [address] Phone - [phone]   Fax - [phone]  [facility] [place] Wyvonne Lenz MD [address] [place] [address] Phone [phone]  Fax [phone]   For colds and allergies  Any anti-histamine including benadryl, allegra, claritin, etc.  Sudafed  but not phenylephrine  Mucinex  Robitussin  For Reflux/heartburn  Pepcid Zantac Tums Prilosec Prevacid  For yeast infections  Monistat  For constipation  Colace  For minor aches and pains  Tylenol-do not take more than  in 24 hours. Therma-care or like heat packs

## 2017-02-11 NOTE — Addendum Note (Signed)
Addended by: Gerome Apley on: 02/11/2017 08:44 AM   Modules accepted: Orders

## 2017-02-23 ENCOUNTER — Inpatient Hospital Stay (HOSPITAL_COMMUNITY)
Admission: AD | Admit: 2017-02-23 | Discharge: 2017-02-23 | Disposition: A | Discharge status: Home / Self Care | Payer: Medicaid Other | Source: Ambulatory Visit | Attending: Family Medicine | Admitting: Family Medicine

## 2017-02-23 ENCOUNTER — Encounter (HOSPITAL_COMMUNITY): Payer: Self-pay

## 2017-02-23 DIAGNOSIS — N949 Unspecified condition associated with female genital organs and menstrual cycle: Secondary | ICD-10-CM | POA: Diagnosis not present

## 2017-02-23 DIAGNOSIS — B9689 Other specified bacterial agents as the cause of diseases classified elsewhere: Secondary | ICD-10-CM | POA: Insufficient documentation

## 2017-02-23 DIAGNOSIS — N760 Acute vaginitis: Secondary | ICD-10-CM

## 2017-02-23 DIAGNOSIS — O0990 Supervision of high risk pregnancy, unspecified, unspecified trimester: Secondary | ICD-10-CM

## 2017-02-23 DIAGNOSIS — O99210 Obesity complicating pregnancy, unspecified trimester: Secondary | ICD-10-CM

## 2017-02-23 DIAGNOSIS — R109 Unspecified abdominal pain: Secondary | ICD-10-CM | POA: Diagnosis present

## 2017-02-23 DIAGNOSIS — O10919 Unspecified pre-existing hypertension complicating pregnancy, unspecified trimester: Secondary | ICD-10-CM

## 2017-02-23 DIAGNOSIS — O26892 Other specified pregnancy related conditions, second trimester: Secondary | ICD-10-CM | POA: Insufficient documentation

## 2017-02-23 DIAGNOSIS — O23592 Infection of other part of genital tract in pregnancy, second trimester: Secondary | ICD-10-CM | POA: Diagnosis not present

## 2017-02-23 DIAGNOSIS — Z3A25 25 weeks gestation of pregnancy: Secondary | ICD-10-CM | POA: Diagnosis not present

## 2017-02-23 DIAGNOSIS — Z789 Other specified health status: Secondary | ICD-10-CM

## 2017-02-23 DIAGNOSIS — Z7982 Long term (current) use of aspirin: Secondary | ICD-10-CM | POA: Diagnosis not present

## 2017-02-23 LAB — URINALYSIS, ROUTINE W REFLEX MICROSCOPIC
BILIRUBIN URINE: NEGATIVE
Glucose, UA: NEGATIVE mg/dL
HGB URINE DIPSTICK: NEGATIVE
Ketones, ur: NEGATIVE mg/dL
Leukocytes, UA: NEGATIVE
NITRITE: NEGATIVE
PH: 7 (ref 5.0–8.0)
Protein, ur: NEGATIVE mg/dL
SPECIFIC GRAVITY, URINE: 1.005 (ref 1.005–1.030)

## 2017-02-23 LAB — WET PREP, GENITAL
TRICH WET PREP: NONE SEEN
YEAST WET PREP: NONE SEEN

## 2017-02-23 MED ORDER — METRONIDAZOLE 500 MG PO TABS: 500.0000 mg | ORAL_TABLET | Freq: Two times a day (BID) | ORAL | 0 refills | Status: DC

## 2017-02-23 MED ORDER — ACETAMINOPHEN 500 MG PO TABS
1000.0000 mg | ORAL_TABLET | Freq: Once | ORAL | Status: AC
  Administered 2017-02-23: 1000 mg via ORAL
  Filled 2017-02-23: qty 2

## 2017-02-23 NOTE — MAU Provider Note (Signed)
History     CSN: [id number]  Arrival date and time: 02/23/17 4098   First Provider Initiated Contact with Patient 02/23/17 1857      Chief Complaint  Patient presents with  . Vaginal Discharge  . Abdominal Pain  . Back Pain   HPI [name] is a 46 y.o. [phone] at [redacted]w[redacted]d who presents to MAU today with complaint of cramping. The patient states that pain has been intermittent since 1400 today. She has not taken anything for pain. She denies contractions, vaginal bleeding or LOF. She has had some mucous discharge with yellow discoloration at times. She reports normal fetal movement. She has not taken anything for pain. She feels pain is worse with ambulation. She has CHTN controlled with Labetalol.   OB History    Gravida Para Term Preterm AB Living   5 3 3  0 1 3   SAB TAB Ectopic Multiple Live Births   0 1 0 0 3      Past Medical History:  Diagnosis Date  . Cholecystitis, acute with cholelithiasis 06/21/2012  . Hypertension   . Varicose veins of both lower extremities     Past Surgical History:  Procedure Laterality Date  . ANKLE SURGERY    . CHOLECYSTECTOMY N/A 06/22/2012   Procedure: LAPAROSCOPIC CHOLECYSTECTOMY WITH ATTEMPTED CHOLANGIOGRAM;  Surgeon: Shelly Rubenstein, MD;  Location: [facility] OR;  Service: General;  Laterality: N/A;  . DILATION AND CURETTAGE OF UTERUS    . ENDOSCOPIC VEIN LASER TREATMENT      History reviewed. No pertinent family history.  Social History  Substance Use Topics  . Smoking status: Never Smoker  . Smokeless tobacco: Never Used  . Alcohol use No    Allergies: No Known Allergies  Prescriptions Prior to Admission  Medication Sig Dispense Refill Last Dose  . acetaminophen (TYLENOL) 500 MG tablet Take 500 mg by mouth every 6 (six) hours as needed for mild pain.   Past Month at Unknown time  . aspirin EC 81 MG tablet Take 1 tablet (81 mg total) by mouth daily. 30 tablet 5 02/23/2017 at Unknown time  . labetalol (NORMODYNE) 100 MG  tablet Take 1 tablet (100 mg total) by mouth 2 (two) times daily. 60 tablet 1 02/23/2017 at 0900  . Prenatal Vit-Fe Fumarate-FA (PREPLUS) 27-1 MG TABS Take 1 tablet by mouth daily. 30 tablet 13 02/23/2017 at Unknown time    Review of Systems  Constitutional: Negative for fever.  Gastrointestinal: Positive for abdominal pain. Negative for constipation, diarrhea, nausea and vomiting.  Genitourinary: Positive for vaginal discharge. Negative for dysuria, frequency, urgency and vaginal bleeding.  Musculoskeletal: Positive for back pain.   Physical Exam   Blood pressure 132/68, pulse 75, temperature 98.4 F (36.9 C), temperature source Oral, resp. rate 17, height 5\' 4"  (1.626 m), weight 252 lb (114.3 kg), last menstrual period 08/17/2016, SpO2 99 %.  Physical Exam  Nursing note and vitals reviewed. Constitutional: She is oriented to person, place, and time. She appears well-developed and well-nourished. No distress.  HENT:  Head: Normocephalic and atraumatic.  Cardiovascular: Normal rate.   Respiratory: Effort normal.  GI: Soft. She exhibits no distension and no mass. There is no tenderness. There is no rebound and no guarding.  Genitourinary: Uterus is enlarged (appropriate for GA). Uterus is not tender. Cervix exhibits no motion tenderness, no discharge and no friability. No bleeding in the vagina. Vaginal discharge (small, thin, white without odor) found.  Neurological: She is alert and oriented to person,  place, and time.  Skin: Skin is warm and dry. No erythema.  Psychiatric: She has a normal mood and affect.  Dilation: Closed Effacement (%): Thick Cervical Position: Posterior Exam by:: Vonzella NippleJulie Elvin Mccartin, PA-C  Results for orders placed or performed during the hospital encounter of 02/23/17 (from the past 24 hour(s))  Urinalysis, Routine w reflex microscopic     Status: None   Collection Time: 02/23/17  6:32 PM  Result Value Ref Range   Color, Urine YELLOW YELLOW   APPearance CLEAR  CLEAR   Specific Gravity, Urine 1.005 1.005 - 1.030   pH 7.0 5.0 - 8.0   Glucose, UA NEGATIVE NEGATIVE mg/dL   Hgb urine dipstick NEGATIVE NEGATIVE   Bilirubin Urine NEGATIVE NEGATIVE   Ketones, ur NEGATIVE NEGATIVE mg/dL   Protein, ur NEGATIVE NEGATIVE mg/dL   Nitrite NEGATIVE NEGATIVE   Leukocytes, UA NEGATIVE NEGATIVE  Wet prep, genital     Status: Abnormal   Collection Time: 02/23/17  7:05 PM  Result Value Ref Range   Yeast Wet Prep HPF POC NONE SEEN NONE SEEN   Trich, Wet Prep NONE SEEN NONE SEEN   Clue Cells Wet Prep HPF POC PRESENT (A) NONE SEEN   WBC, Wet Prep HPF POC MANY (A) NONE SEEN   Sperm PRESENT    Fetal Monitoring: Baseline: 130 bpm Variability: moderate Accelerations: 10 x 10 Decelerations: none Contractions: none  MAU Course  Procedures None  MDM UA and wet prep today  1 G Tylenol given   Assessment and Plan  A: SIUP at 7361w3d Round ligament pain  Bacterial vaginosis   P:  Discharge home Rx for Flagyl given to patient  Tylenol PRN for pain advised  Discussed use of abdominal binder Preterm labor precautions discussed Patient advised to follow-up with [facility] as scheduled for routine prenatal care Patient may return to MAU as needed or if her condition were to change or worsen  Vonzella NippleJulie Eloni Darius, PA-C 02/23/2017, 7:45 PM

## 2017-02-23 NOTE — MAU Note (Signed)
Pt reports she started having lower abd pain , lower back pain around 2 pm today. Also reports leaking a yellow fluid since that time.

## 2017-02-23 NOTE — Discharge Instructions (Signed)
Dolor del ligamento redondo (Round Ligament Pain) El ligamento redondo es un cordn de msculo y tejido que sirve de sostn para Careers information officerel tero. Puede volverse una fuente de dolor durante el embarazo si se distiende o se torsiona a medida que el beb crece. Generalmente, el dolor [facility] Hospitalsempieza en el segundo trimestre de Remertonembarazo, y Software engineerpuede aparecer y Landscape architectdesaparecer hasta el momento del Woodridgeparto. No se trata de un problema grave y no es perjudicial para el beb. El dolor del ligamento redondo suele ser agudo y punzante, y durar poco tiempo, pero tambin puede ser sordo, persistente y continuo. Se lo percibe en la regin inferior del abdomen o en la ingle. A menudo comienza en la zona ms profunda de la ingle y se extiende hacia regin externa de la cadera. El Software engineerdolor puede aparecer en los siguientes casos:  Al cambiar repentinamente de posicin.  Al darse vuelta en la cama.  Al toser o estornudar.  Al realizar actividad fsica. INSTRUCCIONES PARA EL CUIDADO EN EL HOGAR Controle su afeccin para ver si hay cambios. Siga estos pasos para Acupuncturistaliviar el dolor:  Cuando el dolor comience, reljese. Luego intente lo siguiente: ? Sentarse. ? Flexionar las rodillas hacia el abdomen. ? Acostarse de costado con una almohada debajo del abdomen y Eastman Chemicalotra entre las piernas. ? Sentarse en una baera con agua tibia durante 15 a 20minutos o hasta que el dolor desaparezca.  Tome los medicamentos de venta libre y los recetados solamente como se lo haya indicado el mdico.  Haga movimientos lentos al sentarse y pararse.  No haga caminatas largas si le generan dolor.  Suspenda o reduzca las actividades fsicas si Public relations account executivele generan dolor. SOLICITE ATENCIN MDICA SI:  El dolor no desaparece con Scientist, research (medical)el tratamiento.  Tiene un dolor en la espalda que no tena antes.  El medicamento no resulta eficaz. SOLICITE ATENCIN MDICA DE INMEDIATO SI:  Tiene escalofros o fiebre.  Tiene contracciones uterinas.  Presenta hemorragia  vaginal.  Siente nuseas o vmitos.  Tiene diarrea.  Siente dolor al ConocoPhillipsorinar. Esta informacin no tiene Theme park managercomo fin reemplazar el consejo del mdico. Asegrese de hacerle al mdico cualquier pregunta que tenga. Document Released: 04/02/2008 Document Revised: 07/13/2011 Document Reviewed: 06/27/2014 Elsevier Interactive Patient Education  2018 [employer] Vaginosis bacteriana (Bacterial Vaginosis) La vaginosis bacteriana es una infeccin de la vagina. Se produce cuando crece una cantidad excesiva de grmenes normales (bacterias sanas) en la vagina. Esta infeccin aumenta el riesgo de contraer otras infecciones de transmisin sexual. El tratamiento de esta infeccin puede ayudar a reducir el riesgo de otras infecciones, como:  Clamidia.  Bettey MareGonorrea.  VIH.  Herpes. CUIDADOS EN EL HOGAR  Tome los medicamentos tal como se lo indic su mdico.  Finalice la prescripcin completa, aunque comience a sentirse mejor.  Comunique a sus compaeros sexuales que sufre una infeccin. Deben consultar a su mdico para iniciar un tratamiento.  Durante el tratamiento: ? Soil scientistvite mantener relaciones sexuales o use preservativos de Network engineerla forma correcta. ? No se haga duchas vaginales. ? No consuma alcohol a menos que el mdico lo autorice. ? No amamante a menos que el mdico la autorice.  SOLICITE AYUDA SI:  No mejora luego de 3 das de Lake Janettratamiento.  Observa una secrecin (prdida) de color gris ms abundante que proviene de la vagina.  Siente ms dolor que antes.  Tiene fiebre.  ASEGRESE DE QUE:  Comprende estas instrucciones.  Controlar su afeccin.  Recibir ayuda de inmediato si no mejora o si empeora.  Esta informacin no tiene Lehman Brotherscomo  fin reemplazar el consejo del mdico. Asegrese de hacerle al mdico cualquier pregunta que tenga. Document Released: 07/17/2008 Document Revised: 08/12/2015 Document Reviewed: 11/30/2012 Elsevier Interactive Patient Education  2017 [employer].

## 2017-02-23 NOTE — MAU Note (Signed)
Hardie ShackletonA. Lashawnda Hancox, RN at the [facility], assume care of patient. Viria at the [facility].

## 2017-03-04 ENCOUNTER — Ambulatory Visit (INDEPENDENT_AMBULATORY_CARE_PROVIDER_SITE_OTHER): Payer: Medicaid Other | Admitting: Obstetrics and Gynecology

## 2017-03-04 VITALS — BP 120/66 | HR 79 | Wt 252.1 lb

## 2017-03-04 DIAGNOSIS — O10919 Unspecified pre-existing hypertension complicating pregnancy, unspecified trimester: Secondary | ICD-10-CM

## 2017-03-04 DIAGNOSIS — O09522 Supervision of elderly multigravida, second trimester: Secondary | ICD-10-CM

## 2017-03-04 DIAGNOSIS — O0990 Supervision of high risk pregnancy, unspecified, unspecified trimester: Secondary | ICD-10-CM

## 2017-03-04 NOTE — Patient Instructions (Signed)

## 2017-03-04 NOTE — Progress Notes (Signed)
Christina Weiss is a 46 y.o. [phone] at 7257w5d here for routine follow up.  She reports some shortness of breath with lying down without wheezing, feels like baby is moving a lot, able to ambulate well without dyspnea.  See flow sheet for details.  A/P: Pregnancy at 3457w5d.  Doing well.   Pregnancy issues include: 1. Preexisting hypertension complicating pregnancy - BP well controlled on current dose of labetalol 100mg  BID, patient compliant and tolerating well - schedule follow up ultrasound for growth, last was on 01/13/17 - check UA today  2. AMA  3.Supervision of high risk pregnancy - on ASA81 - Due for 2 hr glucola in 1 week 4. Varicose veins - Patient counseling on compression hose today  Preterm labor and fetal movement precautions reviewed. Follow up 1 week for 2 hour glucola and ultrasound. OB follow up appointment in 2 weeks.   Christina HerElsia J Latayna Ritchie, DO PGY-2, [facility] 03/04/2017 2:24 PM

## 2017-03-04 NOTE — Progress Notes (Signed)
Patient states she is having extreme difficulty getting comfortable to sleep.

## 2017-03-08 ENCOUNTER — Other Ambulatory Visit: Payer: Medicaid Other

## 2017-03-08 DIAGNOSIS — O0993 Supervision of high risk pregnancy, unspecified, third trimester: Secondary | ICD-10-CM

## 2017-03-09 LAB — GLUCOSE TOLERANCE, 2 HOURS W/ 1HR
GLUCOSE, 2 HOUR: 87 mg/dL (ref 65–152)
GLUCOSE, FASTING: 91 mg/dL (ref 65–91)
Glucose, 1 hour: 142 mg/dL (ref 65–179)

## 2017-03-09 LAB — RPR: RPR Ser Ql: NONREACTIVE

## 2017-03-09 LAB — CBC
HEMOGLOBIN: 9.7 g/dL — AB (ref 11.1–15.9)
Hematocrit: 29.7 % — ABNORMAL LOW (ref 34.0–46.6)
MCH: 27.8 pg (ref 26.6–33.0)
MCHC: 32.7 g/dL (ref 31.5–35.7)
MCV: 85 fL (ref 79–97)
Platelets: 177 10*3/uL (ref 150–379)
RBC: 3.49 x10E6/uL — ABNORMAL LOW (ref 3.77–5.28)
RDW: 15.4 % (ref 12.3–15.4)
WBC: 4.4 10*3/uL (ref 3.4–10.8)

## 2017-03-09 LAB — HIV ANTIBODY (ROUTINE TESTING W REFLEX): HIV SCREEN 4TH GENERATION: NONREACTIVE

## 2017-03-10 ENCOUNTER — Ambulatory Visit (HOSPITAL_COMMUNITY)
Admission: RE | Admit: 2017-03-10 | Discharge: 2017-03-10 | Disposition: A | Discharge status: Home / Self Care | Payer: Medicaid Other | Source: Ambulatory Visit | Attending: Obstetrics and Gynecology | Admitting: Obstetrics and Gynecology

## 2017-03-10 DIAGNOSIS — O09522 Supervision of elderly multigravida, second trimester: Secondary | ICD-10-CM | POA: Insufficient documentation

## 2017-03-10 DIAGNOSIS — Z6841 Body Mass Index (BMI) 40.0 and over, adult: Secondary | ICD-10-CM | POA: Diagnosis not present

## 2017-03-10 DIAGNOSIS — O10912 Unspecified pre-existing hypertension complicating pregnancy, second trimester: Secondary | ICD-10-CM | POA: Diagnosis present

## 2017-03-10 DIAGNOSIS — O321XX0 Maternal care for breech presentation, not applicable or unspecified: Secondary | ICD-10-CM | POA: Diagnosis not present

## 2017-03-10 DIAGNOSIS — O4692 Antepartum hemorrhage, unspecified, second trimester: Secondary | ICD-10-CM | POA: Diagnosis not present

## 2017-03-10 DIAGNOSIS — E669 Obesity, unspecified: Secondary | ICD-10-CM | POA: Insufficient documentation

## 2017-03-10 DIAGNOSIS — Z3A27 27 weeks gestation of pregnancy: Secondary | ICD-10-CM | POA: Insufficient documentation

## 2017-03-10 DIAGNOSIS — O99212 Obesity complicating pregnancy, second trimester: Secondary | ICD-10-CM | POA: Insufficient documentation

## 2017-03-10 DIAGNOSIS — O10919 Unspecified pre-existing hypertension complicating pregnancy, unspecified trimester: Secondary | ICD-10-CM

## 2017-03-12 ENCOUNTER — Telehealth: Payer: Self-pay | Admitting: General Practice

## 2017-03-12 DIAGNOSIS — O10919 Unspecified pre-existing hypertension complicating pregnancy, unspecified trimester: Secondary | ICD-10-CM

## 2017-03-12 DIAGNOSIS — O09522 Supervision of elderly multigravida, second trimester: Secondary | ICD-10-CM

## 2017-03-12 NOTE — Telephone Encounter (Signed)
-----   Message from Hermina StaggersMichael L Ervin, MD sent at 03/12/2017  9:31 AM EST ----- Please schedule F/U U/S in 4 weeks for growth Thanks Casimiro NeedleMichael

## 2017-03-12 NOTE — Telephone Encounter (Signed)
Called patient with pacific intepreter [phone] and informed her of normal ultrasound results but need for repeat ultrasound to check baby's growth. Informed her of appt scheduled 12/7 @ 1045. Patient verbalized understanding & had no questions

## 2017-03-18 ENCOUNTER — Encounter: Payer: Medicaid Other | Admitting: Obstetrics and Gynecology

## 2017-03-18 ENCOUNTER — Ambulatory Visit (INDEPENDENT_AMBULATORY_CARE_PROVIDER_SITE_OTHER): Payer: Medicaid Other | Admitting: Student

## 2017-03-18 VITALS — BP 137/80 | HR 82 | Wt 253.0 lb

## 2017-03-18 DIAGNOSIS — O09522 Supervision of elderly multigravida, second trimester: Secondary | ICD-10-CM

## 2017-03-18 DIAGNOSIS — O10919 Unspecified pre-existing hypertension complicating pregnancy, unspecified trimester: Secondary | ICD-10-CM

## 2017-03-18 DIAGNOSIS — O0990 Supervision of high risk pregnancy, unspecified, unspecified trimester: Secondary | ICD-10-CM

## 2017-03-18 DIAGNOSIS — O0992 Supervision of high risk pregnancy, unspecified, second trimester: Secondary | ICD-10-CM

## 2017-03-18 DIAGNOSIS — O10912 Unspecified pre-existing hypertension complicating pregnancy, second trimester: Secondary | ICD-10-CM

## 2017-03-18 NOTE — Patient Instructions (Signed)
Hipertensin durante el embarazo (Hypertension During Pregnancy) La hipertensin tambin se denomina presin arterial alta. La presin arterial hace que se mueva la sangre en el cuerpo. A veces, la fuerza que Northrop Grummanmueve la sangre es demasiado intensa. Cuando est embarazada, esta afeccin se debe controlar atentamente. Puede causar problemas para usted y su beb. CUIDADOS EN EL HOGAR  Cumpla con todos los controles mdicos.  Tome los United Parcelmedicamentos como le indic su mdico. Informe a su mdico sobre todos los medicamentos que toma.  Coma muy poca sal.  Haga ejercicios regularmente.  No beba alcohol.  No fume.  No tome bebidas con cafena.  Acustese sobre el lado izquierdo cuando haga reposo.  Su mdico puede recomendarle que tome una aspirina de dosis baja (81 mg) cada da.  SOLICITE AYUDA DE INMEDIATO SI:  Siente un dolor intenso en el vientre (abdominal).  Nota una hinchazn repentina Jabil Circuiten las manos, los tobillos o la cara.  Aument 4libras (1,8kg) o ms en 1semana.  Vomita) repetidas veces.  Tiene una hemorragia por la vagina.  No siente los movimientos del beb.  Tiene cefalea.  Tiene visin doble o borrosa.  Tiene calambres o espasmos musculares.  Le falta el aire.  Tiene las yemas de los dedos y los labios Gileadazules.  Observa sangre en la orina.  ASEGRESE DE QUE:  Comprende estas instrucciones.  Controlar su afeccin.  Recibir ayuda de inmediato si no mejora o si empeora.  Esta informacin no tiene Theme park managercomo fin reemplazar el consejo del mdico. Asegrese de hacerle al mdico cualquier pregunta que tenga. Document Released: 08/05/2010 Document Revised: 05/11/2014 Document Reviewed: 10/04/2015 Elsevier Interactive Patient Education  2017 Arvin[employer]Elsevier Inc.

## 2017-03-19 NOTE — Progress Notes (Signed)
   PRENATAL VISIT NOTE  Subjective:  Jonna MunroBlanca Y Morath is a 46 y.o. [phone] at 5333w6d being seen today for ongoing prenatal care.  She is currently monitored for the following issues for this high-risk pregnancy and has Preexisting hypertension complicating pregnancy, antepartum; Advanced maternal age in multigravida, second trimester; Supervision of high risk pregnancy, antepartum; Maternal obesity, antepartum; and Language barrier on their problem list.  Patient reports no complaints.   .  .   . Denies leaking of fluid.   The following portions of the patient's history were reviewed and updated as appropriate: allergies, current medications, past family history, past medical history, past social history, past surgical history and problem list. Problem list updated.  Objective:   Vitals:   03/18/17 1052  BP: 137/80  Pulse: 82  Weight: 253 lb (114.8 kg)    Fetal Status: Fetal Heart Rate (bpm): 142 Fundal Height: 31 cm       General:  Alert, oriented and cooperative. Patient is in no acute distress.  Skin: Skin is warm and dry. No rash noted.   Cardiovascular: Normal heart rate noted  Respiratory: Normal respiratory effort, no problems with respiration noted  Abdomen: Soft, gravid, appropriate for gestational age.        Pelvic: Cervical exam deferred        Extremities: Normal range of motion.     Mental Status:  Normal mood and affect. Normal behavior. Normal judgment and thought content.   Assessment and Plan:  Pregnancy: A5W0981G5P3013 at 7433w6d  1. Supervision of high risk pregnancy, antepartum Has US scheduled for 12-7; begin antenatal testing at 36 weeks  2. Preexisting hypertension complicating pregnancy, antepartum On baby ASA and labetalol, bp is well controlled   3. Advanced maternal age in multigravida, second trimester Begin antental testing at 36 weeks.   Preterm labor symptoms and general obstetric precautions including but not limited to vaginal bleeding,  contractions, leaking of fluid and fetal movement were reviewed in detail with the patient. Please refer to After Visit Summary for other counseling recommendations.  Return in about 2 weeks (around 04/01/2017).   Marylene LandKathryn Lorraine Coumba Kellison, CNM

## 2017-04-01 ENCOUNTER — Ambulatory Visit (INDEPENDENT_AMBULATORY_CARE_PROVIDER_SITE_OTHER): Payer: Medicaid Other | Admitting: Family Medicine

## 2017-04-01 VITALS — BP 135/80 | HR 78 | Wt 254.3 lb

## 2017-04-01 DIAGNOSIS — O10912 Unspecified pre-existing hypertension complicating pregnancy, second trimester: Secondary | ICD-10-CM

## 2017-04-01 DIAGNOSIS — E669 Obesity, unspecified: Secondary | ICD-10-CM

## 2017-04-01 DIAGNOSIS — Z23 Encounter for immunization: Secondary | ICD-10-CM

## 2017-04-01 DIAGNOSIS — O99212 Obesity complicating pregnancy, second trimester: Secondary | ICD-10-CM | POA: Diagnosis not present

## 2017-04-01 DIAGNOSIS — O09522 Supervision of elderly multigravida, second trimester: Secondary | ICD-10-CM

## 2017-04-01 DIAGNOSIS — O99210 Obesity complicating pregnancy, unspecified trimester: Secondary | ICD-10-CM

## 2017-04-01 DIAGNOSIS — Z789 Other specified health status: Secondary | ICD-10-CM | POA: Diagnosis not present

## 2017-04-01 DIAGNOSIS — O0990 Supervision of high risk pregnancy, unspecified, unspecified trimester: Secondary | ICD-10-CM

## 2017-04-01 DIAGNOSIS — O0992 Supervision of high risk pregnancy, unspecified, second trimester: Secondary | ICD-10-CM | POA: Diagnosis present

## 2017-04-01 DIAGNOSIS — O10919 Unspecified pre-existing hypertension complicating pregnancy, unspecified trimester: Secondary | ICD-10-CM

## 2017-04-01 MED ORDER — TETANUS-DIPHTH-ACELL PERTUSSIS 5-2.5-18.5 LF-MCG/0.5 IM SUSP
0.5000 mL | Freq: Once | INTRAMUSCULAR | Status: AC
  Administered 2017-04-01: 0.5 mL via INTRAMUSCULAR

## 2017-04-01 NOTE — Progress Notes (Signed)
Video Interpreter # [phone]

## 2017-04-01 NOTE — Addendum Note (Signed)
Addended by: Faythe CasaBELLAMY, JEANETTA M on: 04/01/2017 02:09 PM   Modules accepted: Orders

## 2017-04-01 NOTE — Progress Notes (Addendum)
   PRENATAL VISIT NOTE  Subjective:  Christina Weiss is a 46 y.o. [phone] at [redacted]w[redacted]d being seen today for ongoing prenatal care.  She is currently monitored for the following issues for this high-risk pregnancy and has Preexisting hypertension complicating pregnancy, antepartum; Advanced maternal age in multigravida, second trimester; Supervision of high risk pregnancy, antepartum; Maternal obesity, antepartum; and Language barrier on their problem list.  Patient reports some difficulty sleeping - recent URI.Marland Kitchen.  Contractions: Not present. Vag. Bleeding: None.  Movement: Present. Denies leaking of fluid.   The following portions of the patient's history were reviewed and updated as appropriate: allergies, current medications, past family history, past medical history, past social history, past surgical history and problem list. Problem list updated.  Objective:   Vitals:   04/01/17 1334  BP: 135/80  Pulse: 78  Weight: 254 lb 4.8 oz (115.3 kg)    Fetal Status: Fetal Heart Rate (bpm): 128   Movement: Present     General:  Alert, oriented and cooperative. Patient is in no acute distress.  Skin: Skin is warm and dry. No rash noted.   Cardiovascular: Normal heart rate noted  Respiratory: Normal respiratory effort, no problems with respiration noted  Abdomen: Soft, gravid, appropriate for gestational age.  Pain/Pressure: Present     Pelvic: Cervical exam deferred        Extremities: Normal range of motion.  Edema: Trace  Mental Status:  Normal mood and affect. Normal behavior. Normal judgment and thought content.   Assessment and Plan:  Pregnancy: [id number] at 615w5d  1. Supervision of high risk pregnancy, antepartum FHT normal  2. Advanced maternal age in multigravida, second trimester Panorama normal. Antenatal testing at 36 weeks US on 12/7  3. Language barrier Interpreter used  4. Maternal obesity, antepartum  5. Preexisting hypertension complicating pregnancy,  antepartum Continue ASA 81mg    Preterm labor symptoms and general obstetric precautions including but not limited to vaginal bleeding, contractions, leaking of fluid and fetal movement were reviewed in detail with the patient. Please refer to After Visit Summary for other counseling recommendations.  Return in about 2 weeks (around 04/15/2017) for HR OB f/u.   Levie HeritageJacob J Lori Liew, DO

## 2017-04-02 ENCOUNTER — Other Ambulatory Visit: Payer: Self-pay

## 2017-04-02 ENCOUNTER — Inpatient Hospital Stay (HOSPITAL_COMMUNITY)
Admission: AD | Admit: 2017-04-02 | Discharge: 2017-04-02 | Disposition: A | Discharge status: Home / Self Care | Payer: Medicaid Other | Source: Ambulatory Visit | Attending: Obstetrics and Gynecology | Admitting: Obstetrics and Gynecology

## 2017-04-02 DIAGNOSIS — O26893 Other specified pregnancy related conditions, third trimester: Secondary | ICD-10-CM | POA: Insufficient documentation

## 2017-04-02 DIAGNOSIS — Z9889 Other specified postprocedural states: Secondary | ICD-10-CM | POA: Diagnosis not present

## 2017-04-02 DIAGNOSIS — R109 Unspecified abdominal pain: Secondary | ICD-10-CM

## 2017-04-02 DIAGNOSIS — Z3A30 30 weeks gestation of pregnancy: Secondary | ICD-10-CM | POA: Insufficient documentation

## 2017-04-02 DIAGNOSIS — Z79899 Other long term (current) drug therapy: Secondary | ICD-10-CM | POA: Diagnosis not present

## 2017-04-02 DIAGNOSIS — O368130 Decreased fetal movements, third trimester, not applicable or unspecified: Secondary | ICD-10-CM | POA: Diagnosis not present

## 2017-04-02 DIAGNOSIS — O99213 Obesity complicating pregnancy, third trimester: Secondary | ICD-10-CM | POA: Diagnosis not present

## 2017-04-02 DIAGNOSIS — O163 Unspecified maternal hypertension, third trimester: Secondary | ICD-10-CM | POA: Insufficient documentation

## 2017-04-02 DIAGNOSIS — O26899 Other specified pregnancy related conditions, unspecified trimester: Secondary | ICD-10-CM

## 2017-04-02 DIAGNOSIS — Z3689 Encounter for other specified antenatal screening: Secondary | ICD-10-CM | POA: Diagnosis not present

## 2017-04-02 DIAGNOSIS — R1030 Lower abdominal pain, unspecified: Secondary | ICD-10-CM | POA: Diagnosis not present

## 2017-04-02 DIAGNOSIS — Z7982 Long term (current) use of aspirin: Secondary | ICD-10-CM | POA: Diagnosis not present

## 2017-04-02 DIAGNOSIS — Z9049 Acquired absence of other specified parts of digestive tract: Secondary | ICD-10-CM | POA: Diagnosis not present

## 2017-04-02 LAB — URINALYSIS, ROUTINE W REFLEX MICROSCOPIC
BILIRUBIN URINE: NEGATIVE
GLUCOSE, UA: NEGATIVE mg/dL
HGB URINE DIPSTICK: NEGATIVE
Ketones, ur: NEGATIVE mg/dL
Leukocytes, UA: NEGATIVE
Nitrite: NEGATIVE
PROTEIN: NEGATIVE mg/dL
SPECIFIC GRAVITY, URINE: 1.004 — AB (ref 1.005–1.030)
pH: 6 (ref 5.0–8.0)

## 2017-04-02 NOTE — Discharge Instructions (Signed)
Evaluación de los movimientos fetales  °(Fetal Movement Counts) °Nombre del paciente: __________________________________________________ Fecha de parto estimada: ____________________ °La evaluación de los movimientos fetales es muy recomendable en los embarazos de alto riesgo, pero también es una buena idea que lo hagan todas las embarazadas. El médico le indicará que comience a contarlos a las 28 semanas de embarazo. Los movimientos fetales suelen aumentar:  °· Después de una comida completa. °· Después de la actividad física. °· Después de comer o beber algo dulce o frío. °· En reposo. °Preste atención cuando sienta que el bebé está más activo. Esto le ayudará a notar un patrón de ciclos de vigilia y sueño de su bebé y cuáles son los factores que contribuyen a un aumento de los movimientos fetales. Es importante llevar a cabo un recuento de movimientos fetales, al mismo tiempo cada día, cuando el bebé normalmente está más activo.  °CÓMO CONTAR LOS MOVIMIENTOS FETALES °1. Busque un lugar tranquilo y cómodo para sentarse o recostarse sobre el lado izquierdo. Al recostarse sobre su lado izquierdo, le proporciona una mejor circulación de sangre y oxígeno al bebé. °2. Anote el día y la hora en una hoja de papel o en un diario. °3. Comience contando las pataditas, revoloteos, chasquidos, vueltas o pinchazos en un período de 2 horas. Debe sentir al menos 10 movimientos en 2 horas. °4. Si no siente 10 movimientos en 2 horas, espere 2 ó 3 horas y cuente de nuevo. Busque cambios en el patrón o si no cuenta lo suficiente en 2 horas. °SOLICITE ATENCIÓN MÉDICA SI:  °· Siente menos de 10 pataditas en 2 horas, en dos intentos. °· No hay movimientos durante una hora. °· El patrón se modifica o le lleva más tiempo cada día contar las 10 pataditas. °· Siente que el bebé no se mueve como lo hace habitualmente. °Fecha: ____________ Movimientos: ____________ Hora de inicio: ____________ Hora de finalización: ____________  °Fecha:  ____________ Movimientos: ____________ Hora de inicio: ____________ Hora de finalización: ____________  °Fecha: ____________ Movimientos: ____________ Hora de inicio: ____________ Hora de finalización: ____________  °Fecha: ____________ Movimientos: ____________ Hora de inicio: ____________ Hora de finalización: ____________  °Fecha: ____________ Movimientos: ____________ Hora de inicio: ____________ Hora de finalización: ____________  °Fecha: ____________ Movimientos: ____________ Hora de inicio: ____________ Hora de finalización: ____________  °Fecha: ____________ Movimientos: ____________ Hora de inicio: ____________ Hora de finalización: ____________  °Fecha: ____________ Movimientos: ____________ Hora de inicio: ____________ Hora de finalización: ____________  °Fecha: ____________ Movimientos: ____________ Hora de inicio: ____________ Hora de finalización: ____________  °Fecha: ____________ Movimientos: ____________ Hora de inicio: ____________ Hora de finalización: ____________  °Fecha: ____________ Movimientos: ____________ Hora de inicio: ____________ Hora de finalización: ____________  °Fecha: ____________ Movimientos: ____________ Hora de inicio: ____________ Hora de finalización: ____________  °Fecha: ____________ Movimientos: ____________ Hora de inicio: ____________ Hora de finalización: ____________  °Fecha: ____________ Movimientos: ____________ Hora de inicio: ____________ Hora de finalización: ____________  °Fecha: ____________ Movimientos: ____________ Hora de inicio: ____________ Hora de finalización: ____________  °Fecha: ____________ Movimientos: ____________ Hora de inicio: ____________ Hora de finalización: ____________  °Fecha: ____________ Movimientos: ____________ Hora de inicio: ____________ Hora de finalización: ____________  °Fecha: ____________ Movimientos: ____________ Hora de inicio: ____________ Hora de finalización: ____________  °Fecha: ____________ Movimientos: ____________ Hora  de inicio: ____________ Hora de finalización: ____________  °Fecha: ____________ Movimientos: ____________ Hora de inicio: ____________ Hora de finalización: ____________  °Fecha: ____________ Movimientos: ____________ Hora de inicio: ____________ Hora de finalización: ____________  °Fecha: ____________ Movimientos: ____________ Hora de inicio: ____________ Hora de   finalizacin: ____________  Franco NonesFecha: ____________ Movimientos: ____________ Stevan BornHora de inicio: ____________ Stevan BornHora de finalizacin: ____________  Franco NonesFecha: ____________ Movimientos: ____________ Stevan BornHora de inicio: ____________ Stevan BornHora de finalizacin: ____________  Franco NonesFecha: ____________ Movimientos: ____________ Stevan BornHora de inicio: ____________ Stevan BornHora de finalizacin: ____________  Franco NonesFecha: ____________ Movimientos: ____________ Stevan BornHora de inicio: ____________ Stevan BornHora de finalizacin: ____________  Franco NonesFecha: ____________ Movimientos: ____________ Stevan BornHora de inicio: ____________ Mammie RussianHora de finalizacin: ____________  Franco NonesFecha: ____________ Movimientos: ____________ Mammie RussianHora de inicio: ____________ Mammie RussianHora de finalizacin: ____________  Franco NonesFecha: ____________ Movimientos: ____________ Mammie RussianHora de inicio: ____________ Mammie RussianHora de finalizacin: ____________  Franco NonesFecha: ____________ Movimientos: ____________ Mammie RussianHora de inicio: ____________ Mammie RussianHora de finalizacin: ____________  Franco NonesFecha: ____________ Movimientos: ____________ Mammie RussianHora de inicio: ____________ Mammie RussianHora de finalizacin: ____________  Franco NonesFecha: ____________ Movimientos: ____________ Mammie RussianHora de inicio: ____________ Mammie RussianHora de finalizacin: ____________  Franco NonesFecha: ____________ Movimientos: ____________ Mammie RussianHora de inicio: ____________ Mammie RussianHora de finalizacin: ____________  Franco NonesFecha: ____________ Movimientos: ____________ Mammie RussianHora de inicio: ____________ Mammie RussianHora de finalizacin: ____________  Franco NonesFecha: ____________ Movimientos: ____________ Mammie RussianHora de inicio: ____________ Mammie RussianHora de finalizacin: ____________  Franco NonesFecha: ____________ Movimientos: ____________ Mammie RussianHora de inicio: ____________ Mammie RussianHora de finalizacin:  ____________  Franco NonesFecha: ____________ Movimientos: ____________ Mammie RussianHora de inicio: ____________ Mammie RussianHora de finalizacin: ____________  Franco NonesFecha: ____________ Movimientos: ____________ Mammie RussianHora de inicio: ____________ Mammie RussianHora de finalizacin: ____________  Franco NonesFecha: ____________ Movimientos: ____________ Mammie RussianHora de inicio: ____________ Mammie RussianHora de finalizacin: ____________  Franco NonesFecha: ____________ Movimientos: ____________ Mammie RussianHora de inicio: ____________ Mammie RussianHora de finalizacin: ____________  Franco NonesFecha: ____________ Movimientos: ____________ Mammie RussianHora de inicio: ____________ Mammie RussianHora de finalizacin: ____________  Franco NonesFecha: ____________ Movimientos: ____________ Mammie RussianHora de inicio: ____________ Mammie RussianHora de finalizacin: ____________  Franco NonesFecha: ____________ Movimientos: ____________ Mammie RussianHora de inicio: ____________ Mammie RussianHora de finalizacin: ____________  Franco NonesFecha: ____________ Movimientos: ____________ Mammie RussianHora de inicio: ____________ Mammie RussianHora de finalizacin: ____________  Franco NonesFecha: ____________ Movimientos: ____________ Mammie RussianHora de inicio: ____________ Mammie RussianHora de finalizacin: ____________  Franco NonesFecha: ____________ Movimientos: ____________ Mammie RussianHora de inicio: ____________ Mammie RussianHora de finalizacin: ____________  Franco NonesFecha: ____________ Movimientos: ____________ Mammie RussianHora de inicio: ____________ Mammie RussianHora de finalizacin: ____________  Franco NonesFecha: ____________ Movimientos: ____________ Mammie RussianHora de inicio: ____________ Mammie RussianHora de finalizacin: ____________  Franco NonesFecha: ____________ Movimientos: ____________ Mammie RussianHora de inicio: ____________ Mammie RussianHora de finalizacin: ____________  Franco NonesFecha: ____________ Movimientos: ____________ Mammie RussianHora de inicio: ____________ Mammie RussianHora de finalizacin: ____________  Franco NonesFecha: ____________ Movimientos: ____________ Mammie RussianHora de inicio: ____________ Mammie RussianHora de finalizacin: ____________  Franco NonesFecha: ____________ Movimientos: ____________ Mammie RussianHora de inicio: ____________ Mammie RussianHora de finalizacin: ____________  Franco NonesFecha: ____________ Movimientos: ____________ Mammie RussianHora de inicio: ____________ Mammie RussianHora de finalizacin: ____________  Franco NonesFecha: ____________  Movimientos: ____________ Mammie RussianHora de inicio: ____________ Mammie RussianHora de finalizacin: ____________  Franco NonesFecha: ____________ Movimientos: ____________ Mammie RussianHora de inicio: ____________ Mammie RussianHora de finalizacin: ____________  Franco NonesFecha: ____________ Movimientos: ____________ Mammie RussianHora de inicio: ____________ Mammie RussianHora de finalizacin: ____________  Esta informacin no tiene como fin reemplazar el consejo del mdico. Asegrese de hacerle al mdico cualquier pregunta que tenga. Document Released: 07/28/2007 Document Revised: 04/06/2012 Elsevier Interactive Patient Education  2017 [employer]  SAFE MEDICATIONS IN PREGNANCY  Acne:  Benzoyl Peroxide  Salicylic Acid   Backache/Headache:  Tylenol: 2 regular strength every 4 hours OR        2 Extra strength every 6 hours   Colds/Coughs/Allergies:  Benadryl (alcohol free) 25 mg every 6 hours as needed  Breath right strips  Claritin  Cepacol throat lozenges  Chloraseptic throat spray  Cold-Eeze- up to three times per day  Cough drops, alcohol free  Flonase (by prescription only)  Guaifenesin  Mucinex  Robitussin DM (plain only, alcohol free)  Saline nasal spray/drops  Sudafed (pseudoephedrine) & Actifed * use only after [redacted] weeks gestation and if you do not have high blood  pressure  Tylenol  Vicks Vaporub  Zinc lozenges  Zyrtec   Constipation:  Colace  Ducolax suppositories  Fleet enema  Glycerin suppositories  Metamucil  Milk of magnesia  Miralax  Senokot  Smooth move tea   Diarrhea:  Kaopectate  Imodium A-D   *NO pepto Bismol   Hemorrhoids:  Anusol  Anusol HC  Preparation H  Tucks   Indigestion:  Tums  Maalox  Mylanta  Zantac  Pepcid   Insomnia:  Benadryl (alcohol free) 25mg  every 6 hours as needed  Tylenol PM  Unisom, no Gelcaps   Leg Cramps:  Tums  MagGel   Nausea/Vomiting:  Bonine  Dramamine  Emetrol  Ginger extract  Sea bands  Meclizine  Nausea medication to take during pregnancy:  Unisom (doxylamine succinate 25 mg  tablets) Take one tablet daily at bedtime. If symptoms are not adequately controlled, the dose can be increased to a maximum recommended dose of two tablets daily (1/2 tablet in the morning, 1/2 tablet mid-afternoon and one at bedtime).  Vitamin B6 100mg  tablets. Take one tablet twice a day (up to 200 mg per day).   Skin Rashes:  Aveeno products  Benadryl cream or 25mg  every 6 hours as needed  Calamine Lotion  1% cortisone cream   Yeast infection:  Gyne-lotrimin 7  Monistat 7    **If taking multiple medications, please check labels to avoid duplicating the same active ingredients  **take medication as directed on the label  ** Do not exceed 4000 mg of tylenol in 24 hours  **Do not take medications that contain aspirin or ibuprofen

## 2017-04-02 NOTE — MAU Note (Signed)
Has not felt normal FM last day and half. Movement much less than usual. Denies LOF or bleeding. Some lower abd pain since this morning.

## 2017-04-02 NOTE — MAU Provider Note (Signed)
History  CSN: [id number] Arrival date and time: 04/02/17 [id number]  First Provider Initiated Contact with Patient 04/02/17 1950      Chief [id number]  Patient presents with  . Decreased Fetal Movement  . Abdominal Pain    HPI: Christina Weiss is a 46 y.o. [phone] with IUP at 7559w6d who presents to maternity admissions reporting decreased fetal movement. She reports baby has been moving much less than usual since yesterday. Has also had some mild lower abdominal pain since this morning, states this feels like pressure from baby's head. Denies contractions, LOF, vaginal bleeding, vaginal discharge, fevers, chills, malaise, dysuria, hematuria, urinary frequency, nausea, vomiting, diarrhea, or constipation.  [facility] Pregnancy complicated by AMA, chronic HTN and obesity.   OB History  Gravida Para Term Preterm AB Living  5 3 3  0 1 3  SAB TAB Ectopic Multiple Live Births  0 1 0 0 3    # Outcome Date GA Lbr Len/2nd Weight Sex Delivery Anes PTL Lv  5 Current           4 TAB           3 Term      Vag-Spont     2 Term      Vag-Spont     1 Term      Vag-Spont        Past Medical History:  Diagnosis Date  . Cholecystitis, acute with cholelithiasis 06/21/2012  . Hypertension   . Varicose veins of both lower extremities    Past Surgical History:  Procedure Laterality Date  . ANKLE SURGERY    . CHOLECYSTECTOMY N/A 06/22/2012   Procedure: LAPAROSCOPIC CHOLECYSTECTOMY WITH ATTEMPTED CHOLANGIOGRAM;  Surgeon: Shelly Rubensteinouglas A Blackman, MD;  Location: [facility] OR;  Service: General;  Laterality: N/A;  . DILATION AND CURETTAGE OF UTERUS    . ENDOSCOPIC VEIN LASER TREATMENT     No family history on file. Social History   Socioeconomic History  . Marital status: Married    Spouse name: Not on file  . Number of children: Not on file  . Years of education: Not on file  . Highest education level: Not on file  Social Needs  . Financial resource strain: Not on file  . Food insecurity -  worry: Not on file  . Food insecurity - inability: Not on file  . Transportation needs - medical: Not on file  . Transportation needs - non-medical: Not on file  Occupational History  . Not on file  Tobacco Use  . Smoking status: Never Smoker  . Smokeless tobacco: Never Used  Substance and Sexual Activity  . Alcohol use: No  . Drug use: No  . Sexual activity: Yes  Other Topics Concern  . Not on file  Social History Narrative  . Not on file   No Known Allergies  Medications Prior to Admission  Medication Sig Dispense Refill Last Dose  . acetaminophen (TYLENOL) 500 MG tablet Take 500 mg by mouth every 6 (six) hours as needed for mild pain.   Taking  . aspirin EC 81 MG tablet Take 1 tablet (81 mg total) by mouth daily. 30 tablet 5 Taking  . labetalol (NORMODYNE) 100 MG tablet Take 1 tablet (100 mg total) by mouth 2 (two) times daily. 60 tablet 1 Taking  . Prenatal Vit-Fe Fumarate-FA (PREPLUS) 27-1 MG TABS Take 1 tablet by mouth daily. 30 tablet 13 Taking    I have reviewed patient's Past Medical Hx, Surgical Hx, Family  Hx, Social Hx, medications and allergies.   Review of Systems: Negative except for what is mentioned in HPI.  Physical Exam   Blood pressure 138/70, pulse 84, height 5\' 4"  (1.626 m), weight 258 lb (117 kg), last menstrual period 08/17/2016.  Constitutional: Well-developed, well-nourished female in no acute distress.  HENT: Tice/AT, normal oropharynx mucosa. MMM Eyes: normal conjunctivae, no scleral icterus Cardiovascular: normal rate Respiratory: normal effort  GI: Abd soft, non-tender, gravid appropriate for gestational age.   SVE: closed/thick MSK: Extremities nontender, no edema Neurologic: Alert and oriented x 4. Psych: Normal mood and affect Skin: warm and dry   FHT:  Baseline 130, moderate variability, accelerations present, no decelerations Toco: no ctx  MAU Course/MDM:   Nursing notes and VS reviewed. Patient seen and examined, as noted above.   Reactive NST SVE reassuring  Assessment and Plan  Assessment: 1. NST (non-stress test) reactive   2. Abdominal pain affecting pregnancy     Plan: --Discharge home in stable condition.  --PTL precautions and fetal kick counts   Daking Westervelt, Kandra NicolasJulie P, MD 04/02/2017 7:50 PM

## 2017-04-09 ENCOUNTER — Encounter (HOSPITAL_COMMUNITY): Payer: Self-pay

## 2017-04-09 ENCOUNTER — Ambulatory Visit (HOSPITAL_COMMUNITY)
Admission: RE | Admit: 2017-04-09 | Discharge: 2017-04-09 | Disposition: A | Discharge status: Home / Self Care | Payer: Medicaid Other | Source: Ambulatory Visit | Attending: Obstetrics and Gynecology | Admitting: Obstetrics and Gynecology

## 2017-04-09 ENCOUNTER — Other Ambulatory Visit: Payer: Self-pay | Admitting: Obstetrics and Gynecology

## 2017-04-09 DIAGNOSIS — O09522 Supervision of elderly multigravida, second trimester: Secondary | ICD-10-CM

## 2017-04-09 DIAGNOSIS — O10919 Unspecified pre-existing hypertension complicating pregnancy, unspecified trimester: Secondary | ICD-10-CM

## 2017-04-09 DIAGNOSIS — O10013 Pre-existing essential hypertension complicating pregnancy, third trimester: Secondary | ICD-10-CM | POA: Insufficient documentation

## 2017-04-09 DIAGNOSIS — O4693 Antepartum hemorrhage, unspecified, third trimester: Secondary | ICD-10-CM | POA: Diagnosis not present

## 2017-04-09 DIAGNOSIS — O99213 Obesity complicating pregnancy, third trimester: Secondary | ICD-10-CM | POA: Insufficient documentation

## 2017-04-09 DIAGNOSIS — O09523 Supervision of elderly multigravida, third trimester: Secondary | ICD-10-CM | POA: Insufficient documentation

## 2017-04-09 DIAGNOSIS — Z3A31 31 weeks gestation of pregnancy: Secondary | ICD-10-CM | POA: Diagnosis not present

## 2017-04-16 ENCOUNTER — Ambulatory Visit (INDEPENDENT_AMBULATORY_CARE_PROVIDER_SITE_OTHER): Payer: Medicaid Other | Admitting: Obstetrics & Gynecology

## 2017-04-16 VITALS — BP 123/59 | HR 76 | Wt 255.9 lb

## 2017-04-16 DIAGNOSIS — O10919 Unspecified pre-existing hypertension complicating pregnancy, unspecified trimester: Secondary | ICD-10-CM

## 2017-04-16 DIAGNOSIS — O0990 Supervision of high risk pregnancy, unspecified, unspecified trimester: Secondary | ICD-10-CM

## 2017-04-16 DIAGNOSIS — O99213 Obesity complicating pregnancy, third trimester: Secondary | ICD-10-CM

## 2017-04-16 DIAGNOSIS — O99210 Obesity complicating pregnancy, unspecified trimester: Secondary | ICD-10-CM

## 2017-04-16 DIAGNOSIS — O09522 Supervision of elderly multigravida, second trimester: Secondary | ICD-10-CM

## 2017-04-16 DIAGNOSIS — O10913 Unspecified pre-existing hypertension complicating pregnancy, third trimester: Secondary | ICD-10-CM

## 2017-04-16 DIAGNOSIS — O09523 Supervision of elderly multigravida, third trimester: Secondary | ICD-10-CM

## 2017-04-16 DIAGNOSIS — O0993 Supervision of high risk pregnancy, unspecified, third trimester: Secondary | ICD-10-CM

## 2017-04-16 LAB — POCT URINALYSIS DIP (DEVICE)
Bilirubin Urine: NEGATIVE
GLUCOSE, UA: NEGATIVE mg/dL
Hgb urine dipstick: NEGATIVE
KETONES UR: NEGATIVE mg/dL
Leukocytes, UA: NEGATIVE
NITRITE: NEGATIVE
PH: 6 (ref 5.0–8.0)
PROTEIN: NEGATIVE mg/dL
Specific Gravity, Urine: 1.01 (ref 1.005–1.030)
UROBILINOGEN UA: 0.2 mg/dL (ref 0.0–1.0)

## 2017-04-16 NOTE — Patient Instructions (Signed)

## 2017-04-16 NOTE — Progress Notes (Signed)
   PRENATAL VISIT NOTE  Subjective:  Christina Weiss is a 46 y.o. [phone] at 6579w6d being seen today for ongoing prenatal care.  She is currently monitored for the following issues for this high-risk pregnancy and has Preexisting hypertension complicating pregnancy, antepartum; Advanced maternal age in multigravida, second trimester; Supervision of high risk pregnancy, antepartum; Maternal obesity, antepartum; and Language barrier on their problem list.  Patient reports no complaints.  Contractions: Irregular. Vag. Bleeding: None.  Movement: Present. Denies leaking of fluid.   The following portions of the patient's history were reviewed and updated as appropriate: allergies, current medications, past family history, past medical history, past social history, past surgical history and problem list. Problem list updated.  Objective:   Vitals:   04/16/17 1036  BP: (!) 123/59  Pulse: 76  Weight: 255 lb 14.4 oz (116.1 kg)    Fetal Status: Fetal Heart Rate (bpm): 148   Movement: Present     General:  Alert, oriented and cooperative. Patient is in no acute distress.  Skin: Skin is warm and dry. No rash noted.   Cardiovascular: Normal heart rate noted  Respiratory: Normal respiratory effort, no problems with respiration noted  Abdomen: Soft, gravid, appropriate for gestational age.  Pain/Pressure: Present     Pelvic: Cervical exam deferred        Extremities: Normal range of motion.  Edema: Mild pitting, slight indentation  Mental Status:  Normal mood and affect. Normal behavior. Normal judgment and thought content.   Assessment and Plan:  Pregnancy: [id number] at 279w6d  1. Advanced maternal age in multigravida, second trimester   2. Supervision of high risk pregnancy, antepartum Growth and monitoring is normal  3. Maternal obesity, antepartum   4. Preexisting hypertension complicating pregnancy, antepartum BP well controlled  Preterm labor symptoms and general obstetric  precautions including but not limited to vaginal bleeding, contractions, leaking of fluid and fetal movement were reviewed in detail with the patient. Please refer to After Visit Summary for other counseling recommendations.  Return in about 1 week (around 04/23/2017) for NST BPP.   Scheryl DarterJames Ho Parisi, MD

## 2017-04-20 ENCOUNTER — Ambulatory Visit (INDEPENDENT_AMBULATORY_CARE_PROVIDER_SITE_OTHER): Payer: Medicaid Other | Admitting: *Deleted

## 2017-04-20 ENCOUNTER — Ambulatory Visit: Payer: Self-pay

## 2017-04-20 VITALS — BP 146/82 | HR 77

## 2017-04-20 DIAGNOSIS — O09523 Supervision of elderly multigravida, third trimester: Secondary | ICD-10-CM

## 2017-04-20 DIAGNOSIS — O10919 Unspecified pre-existing hypertension complicating pregnancy, unspecified trimester: Secondary | ICD-10-CM

## 2017-04-20 NOTE — Progress Notes (Signed)

## 2017-04-21 NOTE — Progress Notes (Signed)
NST performed today was reviewed and was found to be reactive. Subsequent BPP performed today was also reviewed and was found to be 10/10. AFI was also normal. Continue recommended antenatal testing and prenatal care.   Marelyn Rouser, MD, FACOG Attending Obstetrician & Gynecologist, [facility], [facility]   

## 2017-04-28 ENCOUNTER — Ambulatory Visit (INDEPENDENT_AMBULATORY_CARE_PROVIDER_SITE_OTHER): Payer: Medicaid Other | Admitting: Obstetrics & Gynecology

## 2017-04-28 ENCOUNTER — Ambulatory Visit: Payer: Self-pay

## 2017-04-28 ENCOUNTER — Ambulatory Visit (INDEPENDENT_AMBULATORY_CARE_PROVIDER_SITE_OTHER): Payer: Medicaid Other | Admitting: General Practice

## 2017-04-28 ENCOUNTER — Encounter: Payer: Self-pay | Admitting: Obstetrics & Gynecology

## 2017-04-28 VITALS — BP 130/74 | HR 81 | Wt 254.0 lb

## 2017-04-28 DIAGNOSIS — O10913 Unspecified pre-existing hypertension complicating pregnancy, third trimester: Secondary | ICD-10-CM

## 2017-04-28 DIAGNOSIS — O0990 Supervision of high risk pregnancy, unspecified, unspecified trimester: Secondary | ICD-10-CM

## 2017-04-28 DIAGNOSIS — O09523 Supervision of elderly multigravida, third trimester: Secondary | ICD-10-CM

## 2017-04-28 DIAGNOSIS — O09522 Supervision of elderly multigravida, second trimester: Secondary | ICD-10-CM

## 2017-04-28 DIAGNOSIS — O10919 Unspecified pre-existing hypertension complicating pregnancy, unspecified trimester: Secondary | ICD-10-CM

## 2017-04-28 DIAGNOSIS — O0993 Supervision of high risk pregnancy, unspecified, third trimester: Secondary | ICD-10-CM

## 2017-04-28 LAB — POCT URINALYSIS DIP (DEVICE)
GLUCOSE, UA: NEGATIVE mg/dL
Hgb urine dipstick: NEGATIVE
KETONES UR: NEGATIVE mg/dL
LEUKOCYTES UA: NEGATIVE
Nitrite: NEGATIVE
Protein, ur: NEGATIVE mg/dL
SPECIFIC GRAVITY, URINE: 1.02 (ref 1.005–1.030)
Urobilinogen, UA: 0.2 mg/dL (ref 0.0–1.0)
pH: 6 (ref 5.0–8.0)

## 2017-04-28 NOTE — Progress Notes (Signed)
   PRENATAL VISIT NOTE  Subjective:  Christina Weiss is a 46 y.o. [phone] at 3869w4d being seen today for ongoing prenatal care. Patient is Spanish-speaking only, Spanish interpreter present for this encounter. She is currently monitored for the following issues for this high-risk pregnancy and has Preexisting hypertension complicating pregnancy, antepartum; Advanced maternal age in multigravida, third trimester; Supervision of high risk pregnancy, antepartum; Maternal obesity, antepartum; and Language barrier on their problem list.  Patient reports no complaints.  Contractions: Irritability. Vag. Bleeding: None.  Movement: Present. Denies leaking of fluid.   The following portions of the patient's history were reviewed and updated as appropriate: allergies, current medications, past family history, past medical history, past social history, past surgical history and problem list. Problem list updated.  Objective:   Vitals:   04/28/17 1054  BP: 130/74  Pulse: 81  Weight: 254 lb (115.2 kg)    Fetal Status: Fetal Heart Rate (bpm): NST Fundal Height: 35 cm Movement: Present     General:  Alert, oriented and cooperative. Patient is in no acute distress.  Skin: Skin is warm and dry. No rash noted.   Cardiovascular: Normal heart rate noted  Respiratory: Normal respiratory effort, no problems with respiration noted  Abdomen: Soft, gravid, appropriate for gestational age.  Pain/Pressure: Absent     Pelvic: Cervical exam deferred        Extremities: Normal range of motion.  Edema: Mild pitting, slight indentation  Mental Status:  Normal mood and affect. Normal behavior. Normal judgment and thought content.   Assessment and Plan:  Pregnancy: [id number] at 6869w4d  1. Preexisting hypertension complicating pregnancy, antepartum Stable BP on Labetalol. Preeclampsia precautions reviewed.  NST performed today was reviewed and was found to be reactive.  AFI was also normal.  Continue recommended  antenatal testing and prenatal care. Will get growth scan and BPP next week.  2. Advanced maternal age in multigravida, third trimester Continue recommended antenatal testing and prenatal care.  3. Supervision of high risk pregnancy, antepartum Preterm labor symptoms and general obstetric precautions including but not limited to vaginal bleeding, contractions, leaking of fluid and fetal movement were reviewed in detail with the patient. Please refer to After Visit Summary for other counseling recommendations.  Return in about 1 week (around 05/05/2017) for BPP, NST, OB Visit (HOB).   Christina CollinsUgonna Otilia Kareem, MD

## 2017-04-28 NOTE — Progress Notes (Signed)
U/s growth with BPP scheduled for 1/2 @ 315

## 2017-04-28 NOTE — Patient Instructions (Signed)
Regrese a la clinica cuando tenga su cita. Si tiene problemas o preguntas, llama a la clinica o vaya a la [facility] al [facility].    

## 2017-05-03 ENCOUNTER — Other Ambulatory Visit: Payer: Medicaid Other

## 2017-05-03 NOTE — Addendum Note (Signed)
Addended by: Kathee DeltonHILLMAN, CARRIE L on: 05/03/2017 07:39 AM   Modules accepted: Orders

## 2017-05-04 NOTE — L&D Delivery Note (Signed)
Delivery Note At 11:34 PM a viable female was delivered via Vaginal, Spontaneous (Presentation:baby initially presented OA and restituted due to right shoulder dystocia baby was manually rotated LOA and then ROA prior to delivery. Shoulder dystocia lasted approximately 20 sec. Baby delivered and cord was immediately clamped. .  APGAR:7 , 8; weight pending .   Placenta status: intact ,spontaneous .  Cord: 3 vesses   with the following complications: .  Cord pH: pending   Anesthesia:epidural    Episiotomy: none   Lacerations:  3rd degree (3a) Suture Repair: 3.0 Est. Blood Loss (mL):  300, was given methergine and cytotec for continuous trickle. Bleeding stopped before we left the rom  Mom to postpartum.  Baby to Couplet care / Skin to Skin.  Christina Weiss 05/29/2017, 11:58 PM  OB FELLOW DELIVERY ATTESTATION  I was gloved and present for the delivery in its entirety, and I agree with the above resident's note.    Shoulder dystocia was promptly recognized. Attempted McRoberts and suprapubic pressure. I attempted to hook the anterior axilla with my index finger but was unsuccessful. I was also unable to hook the posterior axilla. I then attempted to rotate the baby from LOA position, and this was successful. Once in ROA position, the posterior shoulder delivered with ease. Entire dystocia lasted about 20 seconds. Baby had poor tone at delivery and cord was immediately cut and clamped and baby was taken to warmer. At 1 minute, APGAR was 7. I also repaired the 3rd degree perineal laceration.   Christina BookbinderAmber Gwynne Kemnitz, DO [facility] Fellow 6:09 AM

## 2017-05-05 ENCOUNTER — Ambulatory Visit (HOSPITAL_COMMUNITY)
Admission: RE | Admit: 2017-05-05 | Discharge: 2017-05-05 | Disposition: A | Discharge status: Home / Self Care | Payer: Medicaid Other | Source: Ambulatory Visit | Attending: Obstetrics & Gynecology | Admitting: Obstetrics & Gynecology

## 2017-05-05 ENCOUNTER — Other Ambulatory Visit: Payer: Self-pay | Admitting: Obstetrics & Gynecology

## 2017-05-05 ENCOUNTER — Encounter: Payer: Self-pay | Admitting: Obstetrics and Gynecology

## 2017-05-05 ENCOUNTER — Ambulatory Visit (INDEPENDENT_AMBULATORY_CARE_PROVIDER_SITE_OTHER): Payer: Self-pay | Admitting: Obstetrics and Gynecology

## 2017-05-05 ENCOUNTER — Ambulatory Visit (INDEPENDENT_AMBULATORY_CARE_PROVIDER_SITE_OTHER): Payer: Medicaid Other | Admitting: *Deleted

## 2017-05-05 VITALS — BP 130/73 | HR 79 | Wt 255.2 lb

## 2017-05-05 DIAGNOSIS — Z3A35 35 weeks gestation of pregnancy: Secondary | ICD-10-CM

## 2017-05-05 DIAGNOSIS — E669 Obesity, unspecified: Secondary | ICD-10-CM | POA: Insufficient documentation

## 2017-05-05 DIAGNOSIS — O09523 Supervision of elderly multigravida, third trimester: Secondary | ICD-10-CM

## 2017-05-05 DIAGNOSIS — O0990 Supervision of high risk pregnancy, unspecified, unspecified trimester: Secondary | ICD-10-CM

## 2017-05-05 DIAGNOSIS — O10013 Pre-existing essential hypertension complicating pregnancy, third trimester: Secondary | ICD-10-CM

## 2017-05-05 DIAGNOSIS — O10919 Unspecified pre-existing hypertension complicating pregnancy, unspecified trimester: Secondary | ICD-10-CM

## 2017-05-05 DIAGNOSIS — O99213 Obesity complicating pregnancy, third trimester: Secondary | ICD-10-CM

## 2017-05-05 DIAGNOSIS — O0993 Supervision of high risk pregnancy, unspecified, third trimester: Secondary | ICD-10-CM

## 2017-05-05 DIAGNOSIS — O10913 Unspecified pre-existing hypertension complicating pregnancy, third trimester: Secondary | ICD-10-CM | POA: Diagnosis not present

## 2017-05-05 NOTE — Progress Notes (Signed)
Subjective:  Christina MunroBlanca Y Ellerbe is a 47 y.o. [phone] at 5615w4d being seen today for ongoing prenatal care.  She is currently monitored for the following issues for this high-risk pregnancy and has Preexisting hypertension complicating pregnancy, antepartum; Advanced maternal age in multigravida, third trimester; Supervision of high risk pregnancy, antepartum; Maternal obesity, antepartum; and Language barrier on their problem list.  Patient reports no complaints.  Contractions: Irregular. Vag. Bleeding: None.  Movement: Present. Denies leaking of fluid.   The following portions of the patient's history were reviewed and updated as appropriate: allergies, current medications, past family history, past medical history, past social history, past surgical history and problem list. Problem list updated.  Objective:   Vitals:   05/05/17 0938  BP: 130/73  Pulse: 79  Weight: 115.8 kg (255 lb 3.2 oz)    Fetal Status: Fetal Heart Rate (bpm): NST   Movement: Present     General:  Alert, oriented and cooperative. Patient is in no acute distress.  Skin: Skin is warm and dry. No rash noted.   Cardiovascular: Normal heart rate noted  Respiratory: Normal respiratory effort, no problems with respiration noted  Abdomen: Soft, gravid, appropriate for gestational age. Pain/Pressure: Present     Pelvic:  Cervical exam deferred        Extremities: Normal range of motion.     Mental Status: Normal mood and affect. Normal behavior. Normal judgment and thought content.   Urinalysis:      Assessment and Plan:  Pregnancy: [id number] at 9715w4d  1. Supervision of high risk pregnancy, antepartum Stable Vaginal cultures next visit  2. Advanced maternal age in multigravida, third trimester   3. Preexisting hypertension complicating pregnancy, antepartum BP stable No S/Sx of PEC RNST today Growth scan and BPP today NST/BPP next visit and continue qweekly  Preterm labor symptoms and general obstetric  precautions including but not limited to vaginal bleeding, contractions, leaking of fluid and fetal movement were reviewed in detail with the patient. Please refer to After Visit Summary for other counseling recommendations.  Return in about 1 week (around 05/12/2017) for NST/BPP and HOB.   Hermina StaggersErvin, [place] Gipe L, MD

## 2017-05-05 NOTE — Progress Notes (Signed)
US for growth and BPP today @ 1515

## 2017-05-12 ENCOUNTER — Ambulatory Visit (INDEPENDENT_AMBULATORY_CARE_PROVIDER_SITE_OTHER): Payer: Self-pay | Admitting: Obstetrics and Gynecology

## 2017-05-12 ENCOUNTER — Other Ambulatory Visit (HOSPITAL_COMMUNITY)
Admission: RE | Admit: 2017-05-12 | Discharge: 2017-05-12 | Disposition: A | Discharge status: Home / Self Care | Payer: Medicaid Other | Source: Ambulatory Visit | Attending: Obstetrics and Gynecology | Admitting: Obstetrics and Gynecology

## 2017-05-12 ENCOUNTER — Ambulatory Visit (INDEPENDENT_AMBULATORY_CARE_PROVIDER_SITE_OTHER): Payer: Medicaid Other | Admitting: *Deleted

## 2017-05-12 ENCOUNTER — Ambulatory Visit: Payer: Self-pay

## 2017-05-12 VITALS — BP 137/77 | HR 82 | Wt 256.3 lb

## 2017-05-12 DIAGNOSIS — O10919 Unspecified pre-existing hypertension complicating pregnancy, unspecified trimester: Secondary | ICD-10-CM

## 2017-05-12 DIAGNOSIS — O09523 Supervision of elderly multigravida, third trimester: Secondary | ICD-10-CM

## 2017-05-12 DIAGNOSIS — Z3A00 Weeks of gestation of pregnancy not specified: Secondary | ICD-10-CM | POA: Diagnosis not present

## 2017-05-12 DIAGNOSIS — Z3A36 36 weeks gestation of pregnancy: Secondary | ICD-10-CM

## 2017-05-12 DIAGNOSIS — O0990 Supervision of high risk pregnancy, unspecified, unspecified trimester: Secondary | ICD-10-CM | POA: Insufficient documentation

## 2017-05-12 DIAGNOSIS — O3660X0 Maternal care for excessive fetal growth, unspecified trimester, not applicable or unspecified: Secondary | ICD-10-CM | POA: Insufficient documentation

## 2017-05-12 LAB — POCT URINALYSIS DIP (DEVICE)
BILIRUBIN URINE: NEGATIVE
Glucose, UA: NEGATIVE mg/dL
HGB URINE DIPSTICK: NEGATIVE
Ketones, ur: NEGATIVE mg/dL
Leukocytes, UA: NEGATIVE
Nitrite: NEGATIVE
Protein, ur: NEGATIVE mg/dL
SPECIFIC GRAVITY, URINE: 1.02 (ref 1.005–1.030)
Urobilinogen, UA: 1 mg/dL (ref 0.0–1.0)
pH: 7 (ref 5.0–8.0)

## 2017-05-12 NOTE — Progress Notes (Signed)
Video interpreter Christiane HaJonathan # [phone] used for encounter.    Pt informed that the ultrasound is considered a limited OB ultrasound and is not intended to be a complete ultrasound exam.  Patient also informed that the ultrasound is not being completed with the intent of assessing for fetal or placental anomalies or any pelvic abnormalities.  Explained that the purpose of today's ultrasound is to assess for presentation, BPP and amniotic fluid volume.  Patient acknowledges the purpose of the exam and the limitations of the study.

## 2017-05-12 NOTE — Progress Notes (Signed)
US for growth done 1/2.

## 2017-05-13 LAB — GC/CHLAMYDIA PROBE AMP (~~LOC~~) NOT AT ARMC
CHLAMYDIA, DNA PROBE: NEGATIVE
Neisseria Gonorrhea: NEGATIVE

## 2017-05-13 NOTE — Progress Notes (Signed)
Prenatal Visit Note Date: 05/12/2017 Clinic: [facility]  Subjective:  Jonna MunroBlanca Y Boye is a 47 y.o. [id number] at 6114w5d being seen today for ongoing prenatal care.  She is currently monitored for the following issues for this high-risk pregnancy and has Preexisting hypertension complicating pregnancy, antepartum; Advanced maternal age in multigravida, third trimester; Supervision of high risk pregnancy, antepartum; Maternal obesity, antepartum; Language barrier; and LGA (large for gestational age) fetus affecting management of mother on their problem list.  Patient reports no complaints.   Contractions: Irregular. Vag. Bleeding: None.  Movement: Present. Denies leaking of fluid.   The following portions of the patient's history were reviewed and updated as appropriate: allergies, current medications, past family history, past medical history, past social history, past surgical history and problem list. Problem list updated.  Objective:   Vitals:   05/12/17 1432  BP: 137/77  Pulse: 82  Weight: 256 lb 4.8 oz (116.3 kg)    Fetal Status: Fetal Heart Rate (bpm): NST   Movement: Present     General:  Alert, oriented and cooperative. Patient is in no acute distress.  Skin: Skin is warm and dry. No rash noted.   Cardiovascular: Normal heart rate noted  Respiratory: Normal respiratory effort, no problems with respiration noted  Abdomen: Soft, gravid, appropriate for gestational age. Pain/Pressure: Present     Pelvic:  Cervical exam deferred        Extremities: Normal range of motion.     Mental Status: Normal mood and affect. Normal behavior. Normal judgment and thought content.   Urinalysis: Urine Protein: Negative Urine Glucose: Negative  Assessment and Plan:  Pregnancy: [id number] at 3914w5d  1. Supervision of high risk pregnancy, antepartum Routine care. D/w pt re: BC nv - GC[facility]Chlamydia probe amp [facility] - Strep Gp B NAA  2. Preexisting hypertension  complicating pregnancy, antepartum Doing well on labetalol 100 bid. 1/3 growth u/s shows LGA. Continue with weekly testing. bpp 10/10 today. Delivery at 39wks  3. Advanced maternal age in multigravida, third trimester No current issues. [facility] Low risk female cffdna.   4. Excessive fetal growth affecting management of pregnancy, antepartum, single or unspecified fetus Negative 2hr. Pt states largest was 8lbs and no issues with delivery  Preterm labor symptoms and general obstetric precautions including but not limited to vaginal bleeding, contractions, leaking of fluid and fetal movement were reviewed in detail with the patient. Please refer to After Visit Summary for other counseling recommendations.  Return in about 7 days (around 05/19/2017) for as scheduled.   [place]ays BingPickens, Derrall Hicks, MD

## 2017-05-14 LAB — STREP GP B NAA: Strep Gp B NAA: NEGATIVE

## 2017-05-19 ENCOUNTER — Ambulatory Visit (INDEPENDENT_AMBULATORY_CARE_PROVIDER_SITE_OTHER): Payer: Medicaid Other | Admitting: *Deleted

## 2017-05-19 ENCOUNTER — Ambulatory Visit (INDEPENDENT_AMBULATORY_CARE_PROVIDER_SITE_OTHER): Payer: Self-pay | Admitting: Obstetrics and Gynecology

## 2017-05-19 ENCOUNTER — Ambulatory Visit: Payer: Self-pay

## 2017-05-19 ENCOUNTER — Encounter: Payer: Self-pay | Admitting: General Practice

## 2017-05-19 ENCOUNTER — Encounter: Payer: Self-pay | Admitting: Obstetrics and Gynecology

## 2017-05-19 VITALS — BP 143/74 | HR 84 | Wt 258.8 lb

## 2017-05-19 DIAGNOSIS — O09523 Supervision of elderly multigravida, third trimester: Secondary | ICD-10-CM

## 2017-05-19 DIAGNOSIS — Z3A37 37 weeks gestation of pregnancy: Secondary | ICD-10-CM

## 2017-05-19 DIAGNOSIS — O0993 Supervision of high risk pregnancy, unspecified, third trimester: Secondary | ICD-10-CM

## 2017-05-19 DIAGNOSIS — O10919 Unspecified pre-existing hypertension complicating pregnancy, unspecified trimester: Secondary | ICD-10-CM

## 2017-05-19 DIAGNOSIS — O3660X0 Maternal care for excessive fetal growth, unspecified trimester, not applicable or unspecified: Secondary | ICD-10-CM

## 2017-05-19 DIAGNOSIS — O10913 Unspecified pre-existing hypertension complicating pregnancy, third trimester: Secondary | ICD-10-CM

## 2017-05-19 DIAGNOSIS — O0990 Supervision of high risk pregnancy, unspecified, unspecified trimester: Secondary | ICD-10-CM

## 2017-05-19 DIAGNOSIS — Z789 Other specified health status: Secondary | ICD-10-CM

## 2017-05-19 NOTE — Progress Notes (Signed)

## 2017-05-19 NOTE — Progress Notes (Signed)
   PRENATAL VISIT NOTE  Subjective:  Christina Weiss is a 47 y.o. [phone] at 6965w4d being seen today for ongoing prenatal care.  She is currently monitored for the following issues for this high-risk pregnancy and has Preexisting hypertension complicating pregnancy, antepartum; Advanced maternal age in multigravida, third trimester; Supervision of high risk pregnancy, antepartum; Maternal obesity, antepartum; Language barrier; and LGA (large for gestational age) fetus affecting management of mother on their problem list.  Patient reports no complaints.  Contractions: Irregular.  .  Movement: Present. Denies leaking of fluid.   The following portions of the patient's history were reviewed and updated as appropriate: allergies, current medications, past family history, past medical history, past social history, past surgical history and problem list. Problem list updated.  Objective:   Vitals:   05/19/17 1541  BP: (!) 143/74  Pulse: 84  Weight: 258 lb 12.8 oz (117.4 kg)    Fetal Status: Fetal Heart Rate (bpm): NST   Movement: Present     General:  Alert, oriented and cooperative. Patient is in no acute distress.  Skin: Skin is warm and dry. No rash noted.   Cardiovascular: Normal heart rate noted  Respiratory: Normal respiratory effort, no problems with respiration noted  Abdomen: Soft, gravid, appropriate for gestational age.  Pain/Pressure: Present     Pelvic: Cervical exam deferred        Extremities: Normal range of motion.  Edema: Moderate pitting, indentation subsides rapidly  Mental Status:  Normal mood and affect. Normal behavior. Normal judgment and thought content.   Assessment and Plan:  Pregnancy: [id number] at 5365w4d  1. Supervision of high risk pregnancy, antepartum  2. Preexisting hypertension complicating pregnancy, antepartum On labetalol 100 mg BID Advised to stop baby ASA today BPP today 10/10 IOL will be scheduled for 39 weeks  3. Advanced maternal age in  multigravida, third trimester  4. Excessive fetal growth affecting management of pregnancy, antepartum, single or unspecified fetus Last growth 05/05/17 3850th%tile, > 90th%tile Will repeat growth scan prior to induction  5. Language barrier Spanish interpretor used for visit  Term labor symptoms and general obstetric precautions including but not limited to vaginal bleeding, contractions, leaking of fluid and fetal movement were reviewed in detail with the patient. Please refer to After Visit Summary for other counseling recommendations.  Return in about 1 week (around 05/26/2017) for NST/BPP and HOB.   Conan BowensKelly M Takeira Yanes, MD

## 2017-05-19 NOTE — Progress Notes (Signed)
Interpreter Christina RiggsMariel Weiss present for encounter.  Pt denies H/A or visual disturbances.

## 2017-05-20 ENCOUNTER — Inpatient Hospital Stay (HOSPITAL_COMMUNITY)
Admission: AD | Admit: 2017-05-20 | Discharge: 2017-05-20 | Disposition: A | Discharge status: Home / Self Care | Payer: Medicaid Other | Source: Ambulatory Visit | Attending: Obstetrics & Gynecology | Admitting: Obstetrics & Gynecology

## 2017-05-20 ENCOUNTER — Telehealth (HOSPITAL_COMMUNITY): Payer: Self-pay | Admitting: *Deleted

## 2017-05-20 ENCOUNTER — Encounter (HOSPITAL_COMMUNITY): Payer: Self-pay | Admitting: *Deleted

## 2017-05-20 DIAGNOSIS — O368130 Decreased fetal movements, third trimester, not applicable or unspecified: Secondary | ICD-10-CM

## 2017-05-20 DIAGNOSIS — O163 Unspecified maternal hypertension, third trimester: Secondary | ICD-10-CM | POA: Diagnosis not present

## 2017-05-20 DIAGNOSIS — Z3A37 37 weeks gestation of pregnancy: Secondary | ICD-10-CM | POA: Diagnosis not present

## 2017-05-20 DIAGNOSIS — O10919 Unspecified pre-existing hypertension complicating pregnancy, unspecified trimester: Secondary | ICD-10-CM

## 2017-05-20 DIAGNOSIS — O99210 Obesity complicating pregnancy, unspecified trimester: Secondary | ICD-10-CM

## 2017-05-20 DIAGNOSIS — O0990 Supervision of high risk pregnancy, unspecified, unspecified trimester: Secondary | ICD-10-CM

## 2017-05-20 DIAGNOSIS — Z789 Other specified health status: Secondary | ICD-10-CM

## 2017-05-20 NOTE — MAU Provider Note (Signed)
None     Chief Complaint:  Decreased Fetal Movement   SARINAH DOETSCH is  47 y.o. [phone] at [redacted]w[redacted]d presents complaining of Decreased Fetal Movement Pt has felt the baby move "many time" since coming to MAU. Baby audibly moving  Obstetrical/Gynecological History: OB History    Gravida Para Term Preterm AB Living   5 3 3  0 1 3   SAB TAB Ectopic Multiple Live Births   0 1 0 0 3     Past Medical History: Past Medical History:  Diagnosis Date  . Cholecystitis, acute with cholelithiasis 06/21/2012  . Hypertension   . Varicose veins of both lower extremities     Past Surgical History: Past Surgical History:  Procedure Laterality Date  . ANKLE SURGERY    . CHOLECYSTECTOMY N/A 06/22/2012   Procedure: LAPAROSCOPIC CHOLECYSTECTOMY WITH ATTEMPTED CHOLANGIOGRAM;  Surgeon: Shelly Rubenstein, MD;  Location: [facility];  Service: General;  Laterality: N/A;  . DILATION AND CURETTAGE OF UTERUS    . ENDOSCOPIC VEIN LASER TREATMENT      Family History: Family History  Problem Relation Age of Onset  . Hypertension Mother   . Varicose Veins Mother   . Asthma Neg Hx   . Arthritis Neg Hx   . Alcohol abuse Neg Hx   . Birth defects Neg Hx   . Cancer Neg Hx   . Depression Neg Hx   . COPD Neg Hx   . Diabetes Neg Hx   . Drug abuse Neg Hx   . Early death Neg Hx   . Hearing loss Neg Hx   . Heart disease Neg Hx   . Hyperlipidemia Neg Hx   . Kidney disease Neg Hx   . Learning disabilities Neg Hx   . Mental illness Neg Hx   . Mental retardation Neg Hx   . Miscarriages / Stillbirths Neg Hx   . Vision loss Neg Hx   . Stroke Neg Hx     Social History: Social History   Tobacco Use  . Smoking status: Never Smoker  . Smokeless tobacco: Never Used  Substance Use Topics  . Alcohol use: No  . Drug use: No    Allergies: No Known Allergies  Meds:  Medications Prior to Admission  Medication Sig Dispense Refill Last Dose  . acetaminophen (TYLENOL) 500 MG tablet Take 500 mg by mouth every  6 (six) hours as needed for mild pain.   Past Month at Unknown time  . labetalol (NORMODYNE) 100 MG tablet Take 1 tablet (100 mg total) by mouth 2 (two) times daily. 60 tablet 1 05/20/2017 at 2030  . Prenatal Vit-Fe Fumarate-FA (PREPLUS) 27-1 MG TABS Take 1 tablet by mouth daily. 30 tablet 13 05/20/2017 at Unknown time  . aspirin EC 81 MG tablet Take 1 tablet (81 mg total) by mouth daily. (Patient not taking: Reported on 05/20/2017) 30 tablet 5 Not Taking at Unknown time    Review of Systems   Constitutional: Negative for fever and chills Eyes: Negative for visual disturbances Respiratory: Negative for shortness of breath, dyspnea Cardiovascular: Negative for chest pain or palpitations  Gastrointestinal: Negative for vomiting, diarrhea and constipation Genitourinary: Negative for dysuria and urgency Musculoskeletal: Negative for back pain, joint pain, myalgias.  Normal ROM  Neurological: Negative for dizziness and headaches    Physical Exam  Blood pressure (!) 136/94, pulse 87, last menstrual period 08/17/2016. GENERAL: Well-developed, well-nourished female in no acute distress.  LUNGS: Clear to auscultation bilaterally.  HEART: Regular rate and  rhythm. ABDOMEN: Soft, nontender, nondistended, gravid.  EXTREMITIES: Nontender, no edema, 2+ distal pulses. DTR's 2+ CERVICAL EXAM: Dilatation 0.5cm   Effacement 0%   Station -3   Presentation: cephalic FHT:  Baseline rate 140 bpm   Variability moderate  Accelerations present   Decelerations none Contractions: Every rare   Labs: No results found for this or any previous visit (from the past 24 hour(s)). Imaging Studies:  US Ob Limited  Result Date: 04/28/2017 ----------------------------------------------------------------------  OBSTETRICS REPORT                      (Signed Final 04/28/2017 04:40 pm) ---------------------------------------------------------------------- Patient Info  ID #:       [id number]                          D.O.B.:   1971-03-19 (46 yrs)  Name:       LADAIJA DIMINO [facility]              Visit Date: 04/28/2017 11:54 am ---------------------------------------------------------------------- Performed By  Performed By:     Marylynn Pearson RN,     Secondary Phy.:   [facility]                    BSN                                                             Center for                                                             [facility]                                                             Healthcare  Attending:        Jaynie Collins         Address:          [facility]                    MD                                                             OB[facility]yn Clinic                                                             [address]                                                             [place], [place]                                                             [address]  Referred By:      Brand Males                Location:         Center for                    [facility] NP                              [facility]  Ref. Address:     [address]                    Alene Mires ---------------------------------------------------------------------- Orders   #  Description                                 Code   1  US OB LIMITED                               60454.0  ----------------------------------------------------------------------   #  Ordered By               Order #        Accession #    Episode #   1  Jaynie Collins           [id number]      [id number]     [id number]  ---------------------------------------------------------------------- Indications   [redacted] weeks gestation of pregnancy                Z3A.32   Advanced maternal age multigravida 43+,        O22.523   third trimester   Pre-existing essential hypertension            O10.013   complicating  pregnancy, third trimester  ---------------------------------------------------------------------- OB History  Gravidity:    5         Term:   3  TOP:          1        Living:  3 ---------------------------------------------------------------------- Fetal Evaluation  Num Of Fetuses:     1  Preg. Location:     Intrauterine  Cardiac Activity:   Observed  Presentation:       Cephalic  Amniotic Fluid  AFI FV:      Subjectively within normal limits  AFI Sum(cm)     %Tile       Largest Pocket(cm)  9.41            16          3.84  RUQ(cm)       RLQ(cm)       LUQ(cm)        LLQ(cm)  3.84          3.31          2.26           0  Comment:    Fetal breathing & movement observed ---------------------------------------------------------------------- Gestational Age  LMP:           36w 2d        Date:  08/17/16                 EDD:   05/24/17  Best:          34w 4d     Det. ByMarcella Dubs         EDD:   06/05/17                                      (10/15/16) ---------------------------------------------------------------------- Impression  IUP at  [redacted]w[redacted]d  Normal amniotic fluid volume ---------------------------------------------------------------------- Recommendations  Continue recommended antenatal testing ----------------------------------------------------------------------               Jaynie Collins, MD Electronically Signed Final Report   04/28/2017 04:40 pm ----------------------------------------------------------------------  [place] Mfm Fetal Bpp Wo Non Stress  Result Date: 05/06/2017 ----------------------------------------------------------------------  OBSTETRICS REPORT                      (Signed Final 05/06/2017 07:04 pm) ---------------------------------------------------------------------- Patient Info  ID #:       [id number]                          D.O.B.:  Jun 06, 1970 (46 yrs)  Name:       LAJOY VANAMBURG [facility]              Visit Date: 05/05/2017 03:05 pm  ---------------------------------------------------------------------- Performed By  Performed By:     Lenise Arena        Secondary Phy.:   [facility] for                                                             [facility]  Healthcare  Attending:        Particia Nearing MD       Address:          [facility]                                                             [address]                                                             [place], [place]                                                             [address]  Referred By:      Brand Males                Location:         [facility] NP  Ref. Address:     Faculty Practice ---------------------------------------------------------------------- Orders   #  Description                                 Code   1  [place] MFM OB FOLLOW UP                         [id number]   2  [place] MFM FETAL BPP WO NON STRESS              60454.09  ----------------------------------------------------------------------   #  Ordered By               Order #        Accession #    Episode #   1  Scheryl Darter             [id number]      [id number]     [id number]   2  Jaynie Collins           [id number]      [id number]  [id number]  ---------------------------------------------------------------------- Indications   [redacted] weeks gestation of pregnancy                Z3A.36   Advanced maternal age multigravida 34+,        O23.523   third trimester; low risk NIPS   Pre-existing essential hypertension            O10.013   complicating pregnancy, third trimester   Obesity complicating pregnancy                 O99.210 E66.9   ---------------------------------------------------------------------- OB History  Gravidity:    5         Term:   3  TOP:          1        Living:  3 ---------------------------------------------------------------------- Fetal Evaluation  Num Of Fetuses:     1  Fetal Heart         148  Rate(bpm):  Cardiac Activity:   Observed  Presentation:       Cephalic  Placenta:           Posterior, above cervical os  P. Cord Insertion:  Previously Visualized  Amniotic Fluid  AFI FV:      Subjectively within normal limits  AFI Sum(cm)     %Tile       Largest Pocket(cm)  15.32           56          4.75  RUQ(cm)       RLQ(cm)       LUQ(cm)        LLQ(cm)  4.75          3.31          3.63           3.63 ---------------------------------------------------------------------- Biophysical Evaluation  Amniotic F.V:   Within normal limits       F. Tone:        Observed  F. Movement:    Observed                   Score:          8/8  F. Breathing:   Observed ---------------------------------------------------------------------- Biometry  BPD:      91.4  mm     G. Age:  37w 1d         90  %    CI:        80.66   %    70 - 86                                                          FL/HC:      23.4   %    20.1 - 22.1  HC:      321.4  mm     G. Age:  36w 2d         35  %    HC/AC:      0.86        0.93 - 1.11  AC:      375.6  mm     G. Age:  41w 3d       > 97  %    FL/BPD:     82.3   %  71 - 87  FL:       75.2  mm     G. Age:  38w 4d         96  %    FL/AC:      20.0   %    20 - 24  HUM:      67.5  mm     G. Age:  39w 2d       > 95  %  Est. FW:    3850  gm      8 lb 8 oz   > 90  % ---------------------------------------------------------------------- Gestational Age  LMP:           37w 2d        Date:  08/17/16                 EDD:   05/24/17  U/S Today:     38w 3d                                        EDD:   05/16/17  Best:          35w 4d     Det. ByMarcella Dubs         EDD:   06/05/17                                       (10/15/16) ---------------------------------------------------------------------- Anatomy  Cranium:               Appears normal         Aortic Arch:            Previously seen  Cavum:                 Appears normal         Ductal Arch:            Previously seen  Ventricles:            Appears normal         Diaphragm:              Previously seen  Choroid Plexus:        Previously seen;       Stomach:                Appears normal, left                         upper limits of nl                             sided  Cerebellum:            Previously seen        Abdomen:                Appears normal  Posterior Fossa:       Previously seen        Abdominal Wall:         Previously seen  Nuchal Fold:           Not applicable (>20    Cord Vessels:           Previously seen  wks GA)  Face:                  Orbits and profile     Kidneys:                Appear normal                         previously seen  Lips:                  Previously seen        Bladder:                Appears normal  Thoracic:              Appears normal         Spine:                  Limited views                                                                        appear normal  Heart:                 Previously seen        Upper Extremities:      Previously seen  RVOT:                  Previously seen        Lower Extremities:      Previously seen  LVOT:                  Previously seen  Other:  Female gender previously seen. Heels previously          visualized.Technically difficult due to maternal habitus. Technically          difficult due to fetal position. ---------------------------------------------------------------------- Cervix Uterus Adnexa  Cervix  Not visualized (advanced GA >29wks) ---------------------------------------------------------------------- Impression  SIUP at 35+4 weeks  Cephalic presentation  Normal interval anatomy; anatomic survey complete  Normal amniotic fluid volume  EFW > 90th %tile; AC  > 97th %tile; 8+8; 3850 grams; fetus at  risk to be LGA  BPP 8/8 ---------------------------------------------------------------------- Recommendations  Continue antenatal testing ----------------------------------------------------------------------                 Particia Nearing, MD Electronically Signed Final Report   05/06/2017 07:04 pm ----------------------------------------------------------------------  [place] Mfm Ob Follow Up  Result Date: 05/06/2017 ----------------------------------------------------------------------  OBSTETRICS REPORT                      (Signed Final 05/06/2017 07:04 pm) ---------------------------------------------------------------------- Patient Info  ID #:       [id number]                          D.O.B.:  [birth year] (46 yrs)  Name:       LELAH RENNAKER [facility]              Visit Date: 05/05/2017 03:05 pm ---------------------------------------------------------------------- Performed By  Performed By:     Lenise Arena        Secondary Phy.:   [facility]                                                             Center for                                                             Women's                                                             Healthcare  Attending:        Particia Nearing MD       Address:          [facility]                                                             [address]                                                             [place], [place]                                                             [address]  Referred By:      Brand Males                Location:         [facility] NP  Ref. Address:     Faculty Practice ---------------------------------------------------------------------- Orders   #  Description  Code    1  [place] MFM OB FOLLOW UP                         [id number]   2  [place] MFM FETAL BPP WO NON STRESS              76819.01  ----------------------------------------------------------------------   #  Ordered By               Order #        Accession #    Episode #   1  Scheryl Darter             [id number]      [id number]     [id number]   2  Jaynie Collins           [id number]      [id number]     [id number]  ---------------------------------------------------------------------- Indications   [redacted] weeks gestation of pregnancy                Z3A.17   Advanced maternal age multigravida 39+,        O78.523   third trimester; low risk NIPS   Pre-existing essential hypertension            O10.013   complicating pregnancy, third trimester   Obesity complicating pregnancy                 O99.210 E66.9  ---------------------------------------------------------------------- OB History  Gravidity:    5         Term:   3  TOP:          1        Living:  3 ---------------------------------------------------------------------- Fetal Evaluation  Num Of Fetuses:     1  Fetal Heart         148  Rate(bpm):  Cardiac Activity:   Observed  Presentation:       Cephalic  Placenta:           Posterior, above cervical os  P. Cord Insertion:  Previously Visualized  Amniotic Fluid  AFI FV:      Subjectively within normal limits  AFI Sum(cm)     %Tile       Largest Pocket(cm)  15.32           56          4.75  RUQ(cm)       RLQ(cm)       LUQ(cm)        LLQ(cm)  4.75          3.31          3.63           3.63 ---------------------------------------------------------------------- Biophysical Evaluation  Amniotic F.V:   Within normal limits       F. Tone:        Observed  F. Movement:    Observed                   Score:          8/8  F. Breathing:   Observed ---------------------------------------------------------------------- Biometry  BPD:      91.4  mm     G. Age:  37w 1d         90  %    CI:        80.66   %    70 - 86  FL/HC:      23.4   %    20.1 - 22.1  HC:      321.4  mm     G. Age:  36w 2d         35  %    HC/AC:      0.86        0.93 - 1.11  AC:      375.6  mm     G. Age:  41w 3d       > 97  %    FL/BPD:     82.3   %    71 - 87  FL:       75.2  mm     G. Age:  38w 4d         96  %    FL/AC:      20.0   %    20 - 24  HUM:      67.5  mm     G. Age:  39w 2d       > 95  %  Est. FW:    3850  gm      8 lb 8 oz   > 90  % ---------------------------------------------------------------------- Gestational Age  LMP:           37w 2d        Date:  08/17/16                 EDD:   05/24/17  U/S Today:     38w 3d                                        EDD:   05/16/17  Best:          35w 4d     Det. ByMarcella Dubs         EDD:   06/05/17                                      (10/15/16) ---------------------------------------------------------------------- Anatomy  Cranium:               Appears normal         Aortic Arch:            Previously seen  Cavum:                 Appears normal         Ductal Arch:            Previously seen  Ventricles:            Appears normal         Diaphragm:              Previously seen  Choroid Plexus:        Previously seen;       Stomach:                Appears normal, left                         upper limits of nl  sided  Cerebellum:            Previously seen        Abdomen:                Appears normal  Posterior Fossa:       Previously seen        Abdominal Wall:         Previously seen  Nuchal Fold:           Not applicable (>20    Cord Vessels:           Previously seen                         wks GA)  Face:                  Orbits and profile     Kidneys:                Appear normal                         previously seen  Lips:                  Previously seen        Bladder:                Appears normal  Thoracic:              Appears normal         Spine:                  Limited views                                                                         appear normal  Heart:                 Previously seen        Upper Extremities:      Previously seen  RVOT:                  Previously seen        Lower Extremities:      Previously seen  LVOT:                  Previously seen  Other:  Female gender previously seen. Heels previously          visualized.Technically difficult due to maternal habitus. Technically          difficult due to fetal position. ---------------------------------------------------------------------- Cervix Uterus Adnexa  Cervix  Not visualized (advanced GA >29wks) ---------------------------------------------------------------------- Impression  SIUP at 35+4 weeks  Cephalic presentation  Normal interval anatomy; anatomic survey complete  Normal amniotic fluid volume  EFW > 90th %tile; AC > 97th %tile; 8+8; 3850 grams; fetus at  risk to be LGA  BPP 8/8 ---------------------------------------------------------------------- Recommendations  Continue antenatal testing ----------------------------------------------------------------------                 Particia Nearing, MD Electronically Signed Final Report   05/06/2017 07:04 pm ----------------------------------------------------------------------  US Fetal Bpp W/nonstress  Result Date: 05/20/2017 ----------------------------------------------------------------------  OBSTETRICS REPORT                      (Signed Final 05/20/2017 09:54 am) ---------------------------------------------------------------------- Patient Info  ID #:       [id number]                          D.O.B.:  December 01, 1970 (46 yrs)  Name:       LORALIE MALTA [facility]              Visit Date: 05/19/2017 05:20 pm ---------------------------------------------------------------------- Performed By  Performed By:     Sedalia Muta Day RNC          Secondary Phy.:   [facility] for                                                             Women's                                                              Healthcare  Attending:        Leroy Libman MD         Address:          [facility]                                                             [address]                                                             [place], [place]  [id number]  Referred By:      Brand MalesERRI L                [facility]         [facility]  Ref. Address:     Faculty Practice ---------------------------------------------------------------------- Orders   #  Description                                 Code   1  US FETAL BPP W/NONSTRESS                    [phone]  ----------------------------------------------------------------------   #  Ordered By               Order #        Accession #    Episode #   1  Leroy LibmanKELLY DAVIS              [id number]      [id number]     [id number]  ---------------------------------------------------------------------- Service(s) Provided   US Fetal BPP W NST                                   [phone]  ---------------------------------------------------------------------- Indications   [redacted] weeks gestation of pregnancy                Z3A.37   Unspecified pre-existing hypertension          O10.913   complicating pregnancy, third trimester   Advanced maternal age multigravida 35+,        33O09.523   third trimester  ---------------------------------------------------------------------- OB History  Gravidity:    5         Term:   3  TOP:          1        Living:  3 ---------------------------------------------------------------------- Fetal Evaluation  Num Of Fetuses:     1  Preg. Location:     Intrauterine  Cardiac Activity:   Observed   Presentation:       Cephalic  Amniotic Fluid  AFI FV:      Subjectively within normal limits  AFI Sum(cm)     %Tile       Largest Pocket(cm)  12.87           47          4.27  RUQ(cm)       RLQ(cm)       LUQ(cm)        LLQ(cm)  2.89          2.59          4.27           3.12 ----------------------------------------------------------------------  Biophysical Evaluation  Amniotic F.V:   Pocket => 2 cm two         F. Tone:        Observed                  planes  F. Movement:    Observed                   N.S.T:          Reactive  F. Breathing:   Observed                   Score:          10/10 ---------------------------------------------------------------------- Gestational Age  LMP:           39w 2d        Date:  08/17/16                 EDD:   05/24/17  Best:          37w 4d     Det. ByMarcella Dubs         EDD:   06/05/17                                      (10/15/16) ---------------------------------------------------------------------- Impression  BPP 10/10 ---------------------------------------------------------------------- Recommendations  Cont weekly testing ----------------------------------------------------------------------                   Leroy Libman, MD Electronically Signed Final Report   05/20/2017 09:54 am ----------------------------------------------------------------------  US Fetal Bpp W/nonstress  Result Date: 05/17/2017 ----------------------------------------------------------------------  OBSTETRICS REPORT                      (Signed Final 05/17/2017 01:08 pm) ---------------------------------------------------------------------- Patient Info  ID #:       [id number]                          D.O.B.:  [birth year] (46 yrs)  Name:       KELLSEY SANSONE [facility]              Visit Date: 05/12/2017 05:06 pm ---------------------------------------------------------------------- Performed By  Performed By:     Sedalia Muta Day RNC          Secondary Phy.:   [facility] for                                                             [facility]  Healthcare  Attending:        Rockham Bing MD     Address:          [facility]                                                             [address]                                                             [place], [place]                                                             [address]  Referred By:      Brand Males                Location:         Center for                    [facility] NP                              [facility]  Ref. Address:     Faculty Practice ---------------------------------------------------------------------- Orders   #  Description                                 Code   1  US FETAL BPP W/NONSTRESS                    19147.8  ----------------------------------------------------------------------   #  Ordered By               Order #        Accession #  Episode #   1  CHARLIE PICKENS          [id number]      [id number]     [id number]  ---------------------------------------------------------------------- Service(s) Provided   US Fetal BPP W NST                                   [id number]  ---------------------------------------------------------------------- Indications   [redacted] weeks gestation of pregnancy                Z3A.23   Advanced maternal age multigravida 21+,        O37.523   third trimester   Unspecified pre-existing hypertension          O10.913   complicating pregnancy, third trimester  ---------------------------------------------------------------------- OB History  Gravidity:    5         Term:   3  TOP:          1        Living:  3  ---------------------------------------------------------------------- Fetal Evaluation  Num Of Fetuses:     1  Preg. Location:     Intrauterine  Cardiac Activity:   Observed  Presentation:       Cephalic  Amniotic Fluid  AFI FV:      Subjectively within normal limits  AFI Sum(cm)     %Tile       Largest Pocket(cm)  14.81           55          4.76  RUQ(cm)       RLQ(cm)       LUQ(cm)        LLQ(cm)  3.24          4.76          2.68           4.13 ---------------------------------------------------------------------- Biophysical Evaluation  Amniotic F.V:   Pocket => 2 cm two         F. Tone:        Observed                  planes  F. Movement:    Observed                   N.S.T:          Reactive  F. Breathing:   Observed                   Score:          10/10 ---------------------------------------------------------------------- Gestational Age  LMP:           38w 2d        Date:  08/17/16                 EDD:   05/24/17  Best:          36w 4d     Det. By:  Marcella Dubs         EDD:   06/05/17                                      (10/15/16) ---------------------------------------------------------------------- Comments  Technically limited exam due to maternal body habitus and  advanced gestational age. ---------------------------------------------------------------------- Recommendations  Continue AP testing ----------------------------------------------------------------------  Walker [place] Bing, MD Electronically Signed Final Report   05/17/2017 01:08 pm ----------------------------------------------------------------------   Assessment: ADALIAH HIEGEL is  47 y.o. [phone] at [redacted]w[redacted]d presents with decreased movement, resolved.  CHTN, on meds.  Plan: DC home  CRESENZO-DISHMAN,Klyn Kroening 1/17/201910:12 PM

## 2017-05-20 NOTE — Discharge Instructions (Signed)

## 2017-05-20 NOTE — MAU Note (Signed)
Pt reports she has not felt the baby move since 1 pm,lower abd pain

## 2017-05-20 NOTE — Telephone Encounter (Signed)
Preadmission screen Interpreter number [id number] 

## 2017-05-24 ENCOUNTER — Other Ambulatory Visit: Payer: Self-pay | Admitting: Obstetrics and Gynecology

## 2017-05-26 ENCOUNTER — Other Ambulatory Visit: Payer: Self-pay | Admitting: Obstetrics and Gynecology

## 2017-05-26 ENCOUNTER — Ambulatory Visit (HOSPITAL_COMMUNITY)
Admission: RE | Admit: 2017-05-26 | Discharge: 2017-05-26 | Disposition: A | Discharge status: Home / Self Care | Payer: Medicaid Other | Source: Ambulatory Visit | Attending: Obstetrics and Gynecology | Admitting: Obstetrics and Gynecology

## 2017-05-26 DIAGNOSIS — Z3A38 38 weeks gestation of pregnancy: Secondary | ICD-10-CM | POA: Diagnosis not present

## 2017-05-26 DIAGNOSIS — O10919 Unspecified pre-existing hypertension complicating pregnancy, unspecified trimester: Secondary | ICD-10-CM

## 2017-05-26 DIAGNOSIS — O10913 Unspecified pre-existing hypertension complicating pregnancy, third trimester: Secondary | ICD-10-CM | POA: Diagnosis present

## 2017-05-26 DIAGNOSIS — E669 Obesity, unspecified: Secondary | ICD-10-CM | POA: Diagnosis not present

## 2017-05-26 DIAGNOSIS — O3660X0 Maternal care for excessive fetal growth, unspecified trimester, not applicable or unspecified: Secondary | ICD-10-CM

## 2017-05-26 DIAGNOSIS — O09523 Supervision of elderly multigravida, third trimester: Secondary | ICD-10-CM | POA: Insufficient documentation

## 2017-05-26 DIAGNOSIS — O99213 Obesity complicating pregnancy, third trimester: Secondary | ICD-10-CM | POA: Insufficient documentation

## 2017-05-27 ENCOUNTER — Ambulatory Visit (INDEPENDENT_AMBULATORY_CARE_PROVIDER_SITE_OTHER): Payer: Medicaid Other | Admitting: *Deleted

## 2017-05-27 ENCOUNTER — Encounter: Payer: Self-pay | Admitting: Obstetrics and Gynecology

## 2017-05-27 ENCOUNTER — Ambulatory Visit (INDEPENDENT_AMBULATORY_CARE_PROVIDER_SITE_OTHER): Payer: Medicaid Other | Admitting: Obstetrics and Gynecology

## 2017-05-27 VITALS — BP 136/75 | HR 69 | Wt 263.6 lb

## 2017-05-27 DIAGNOSIS — O10919 Unspecified pre-existing hypertension complicating pregnancy, unspecified trimester: Secondary | ICD-10-CM

## 2017-05-27 DIAGNOSIS — O0990 Supervision of high risk pregnancy, unspecified, unspecified trimester: Secondary | ICD-10-CM

## 2017-05-27 DIAGNOSIS — O10913 Unspecified pre-existing hypertension complicating pregnancy, third trimester: Secondary | ICD-10-CM

## 2017-05-27 DIAGNOSIS — O09523 Supervision of elderly multigravida, third trimester: Secondary | ICD-10-CM

## 2017-05-27 DIAGNOSIS — O0993 Supervision of high risk pregnancy, unspecified, third trimester: Secondary | ICD-10-CM

## 2017-05-27 LAB — POCT URINALYSIS DIP (DEVICE)
Glucose, UA: NEGATIVE mg/dL
Hgb urine dipstick: NEGATIVE
Leukocytes, UA: NEGATIVE
Nitrite: NEGATIVE
PH: 7 (ref 5.0–8.0)
PROTEIN: NEGATIVE mg/dL
SPECIFIC GRAVITY, URINE: 1.015 (ref 1.005–1.030)
Urobilinogen, UA: 1 mg/dL (ref 0.0–1.0)

## 2017-05-27 NOTE — Progress Notes (Signed)
Video interpreter [place] [phone]#[id number] used for encounter.  US for growth and BPP done yesterday.  Pt reports having headache today.

## 2017-05-27 NOTE — Progress Notes (Signed)
   PRENATAL VISIT NOTE  Subjective:  Christina Weiss is a 47 y.o. [phone] at 696w5d being seen today for ongoing prenatal care.  She is currently monitored for the following issues for this high-risk pregnancy and has Preexisting hypertension complicating pregnancy, antepartum; Advanced maternal age in multigravida, third trimester; Supervision of high risk pregnancy, antepartum; Maternal obesity, antepartum; Language barrier; and LGA (large for gestational age) fetus affecting management of mother on their problem list.  Patient reports headache this morning which she has not treated with any medication. She denies visual changes, RUQ/epigastrinc pain, nausea or emesis  Contractions: Irregular. Vag. Bleeding: None.  Movement: Present. Denies leaking of fluid.   The following portions of the patient's history were reviewed and updated as appropriate: allergies, current medications, past family history, past medical history, past social history, past surgical history and problem list. Problem list updated.  Objective:   Vitals:   05/27/17 1434  BP: 136/75  Pulse: 69  Weight: 263 lb 9.6 oz (119.6 kg)    Fetal Status: Fetal Heart Rate (bpm): NST   Movement: Present     General:  Alert, oriented and cooperative. Patient is in no acute distress.  Skin: Skin is warm and dry. No rash noted.   Cardiovascular: Normal heart rate noted  Respiratory: Normal respiratory effort, no problems with respiration noted  Abdomen: Soft, gravid, appropriate for gestational age.  Pain/Pressure: Present     Pelvic: Cervical exam deferred        Extremities: Normal range of motion.     Mental Status:  Normal mood and affect. Normal behavior. Normal judgment and thought content.   Assessment and Plan:  Pregnancy: [id number] at 166w5d  1. Supervision of high risk pregnancy, antepartum Patient is doing well Advised patient to increase water intake and to treat headache with tylenol  2. Advanced maternal age in  multigravida, third trimester   3. Preexisting hypertension complicating pregnancy, antepartum Continue labetalol 100 BID 1/23 EFW 4154 gm - Discussed with patient LGA baby and possibility of shoulder dystocia. Biggest baby was 8 lb. Discussed with patient that labor curve will be followed closely  NST reviewed and reactive with baseline 130, mod variability, +accels, no decels IOL scheduled on 1/26  Term labor symptoms and general obstetric precautions including but not limited to vaginal bleeding, contractions, leaking of fluid and fetal movement were reviewed in detail with the patient. Please refer to After Visit Summary for other counseling recommendations.  Return in about 4 weeks (around 06/24/2017) for pp visit.   Catalina AntiguaPeggy Nia Nathaniel, MD

## 2017-05-29 ENCOUNTER — Inpatient Hospital Stay (HOSPITAL_COMMUNITY): Payer: Medicaid Other | Admitting: Anesthesiology

## 2017-05-29 ENCOUNTER — Inpatient Hospital Stay (HOSPITAL_COMMUNITY)
Admission: RE | Admit: 2017-05-29 | Discharge: 2017-05-31 | DRG: 768 | Disposition: A | Discharge status: Home / Self Care | Payer: Medicaid Other | Source: Ambulatory Visit | Attending: Obstetrics & Gynecology | Admitting: Obstetrics & Gynecology

## 2017-05-29 ENCOUNTER — Encounter (HOSPITAL_COMMUNITY): Payer: Self-pay

## 2017-05-29 DIAGNOSIS — O99210 Obesity complicating pregnancy, unspecified trimester: Secondary | ICD-10-CM

## 2017-05-29 DIAGNOSIS — Z789 Other specified health status: Secondary | ICD-10-CM

## 2017-05-29 DIAGNOSIS — O99214 Obesity complicating childbirth: Secondary | ICD-10-CM | POA: Diagnosis present

## 2017-05-29 DIAGNOSIS — Z3A39 39 weeks gestation of pregnancy: Secondary | ICD-10-CM

## 2017-05-29 DIAGNOSIS — O0990 Supervision of high risk pregnancy, unspecified, unspecified trimester: Secondary | ICD-10-CM

## 2017-05-29 DIAGNOSIS — O3663X0 Maternal care for excessive fetal growth, third trimester, not applicable or unspecified: Secondary | ICD-10-CM | POA: Diagnosis present

## 2017-05-29 DIAGNOSIS — O1002 Pre-existing essential hypertension complicating childbirth: Secondary | ICD-10-CM | POA: Diagnosis present

## 2017-05-29 DIAGNOSIS — Z7982 Long term (current) use of aspirin: Secondary | ICD-10-CM

## 2017-05-29 DIAGNOSIS — O10919 Unspecified pre-existing hypertension complicating pregnancy, unspecified trimester: Secondary | ICD-10-CM | POA: Diagnosis present

## 2017-05-29 DIAGNOSIS — O1092 Unspecified pre-existing hypertension complicating childbirth: Secondary | ICD-10-CM

## 2017-05-29 LAB — CBC WITH DIFFERENTIAL/PLATELET
Basophils Absolute: 0 10*3/uL (ref 0.0–0.1)
Basophils Relative: 0 %
EOS PCT: 2 %
Eosinophils Absolute: 0.1 10*3/uL (ref 0.0–0.7)
HCT: 33.1 % — ABNORMAL LOW (ref 36.0–46.0)
Hemoglobin: 11.1 g/dL — ABNORMAL LOW (ref 12.0–15.0)
LYMPHS ABS: 1.4 10*3/uL (ref 0.7–4.0)
Lymphocytes Relative: 21 %
MCH: 28.4 pg (ref 26.0–34.0)
MCHC: 33.5 g/dL (ref 30.0–36.0)
MCV: 84.7 fL (ref 78.0–100.0)
MONO ABS: 0.3 10*3/uL (ref 0.1–1.0)
Monocytes Relative: 4 %
Neutro Abs: 4.9 10*3/uL (ref 1.7–7.7)
Neutrophils Relative %: 73 %
PLATELETS: 156 10*3/uL (ref 150–400)
RBC: 3.91 MIL/uL (ref 3.87–5.11)
RDW: 14.9 % (ref 11.5–15.5)
WBC: 6.8 10*3/uL (ref 4.0–10.5)

## 2017-05-29 LAB — CBC
HCT: 32.5 % — ABNORMAL LOW (ref 36.0–46.0)
HEMOGLOBIN: 10.7 g/dL — AB (ref 12.0–15.0)
MCH: 27.7 pg (ref 26.0–34.0)
MCHC: 32.9 g/dL (ref 30.0–36.0)
MCV: 84.2 fL (ref 78.0–100.0)
Platelets: 165 10*3/uL (ref 150–400)
RBC: 3.86 MIL/uL — AB (ref 3.87–5.11)
RDW: 14.8 % (ref 11.5–15.5)
WBC: 4.8 10*3/uL (ref 4.0–10.5)

## 2017-05-29 LAB — TYPE AND SCREEN
ABO/RH(D): A POS
Antibody Screen: NEGATIVE

## 2017-05-29 LAB — RPR: RPR: NONREACTIVE

## 2017-05-29 MED ORDER — ONDANSETRON HCL 4 MG/2ML IJ SOLN
4.0000 mg | Freq: Four times a day (QID) | INTRAMUSCULAR | Status: DC | PRN
  Administered 2017-05-30: 4 mg via INTRAVENOUS
  Filled 2017-05-29: qty 2

## 2017-05-29 MED ORDER — OXYCODONE-ACETAMINOPHEN 5-325 MG PO TABS: 1.0000 | ORAL_TABLET | ORAL | Status: DC | PRN

## 2017-05-29 MED ORDER — MISOPROSTOL 50MCG HALF TABLET
50.0000 ug | ORAL_TABLET | Freq: Once | ORAL | Status: AC
  Administered 2017-05-29: 50 ug via ORAL
  Filled 2017-05-29: qty 1

## 2017-05-29 MED ORDER — METHYLERGONOVINE MALEATE 0.2 MG/ML IJ SOLN
INTRAMUSCULAR | Status: AC
  Administered 2017-05-29: 0.2 mg via INTRAMUSCULAR
  Filled 2017-05-29: qty 1

## 2017-05-29 MED ORDER — LACTATED RINGERS IV SOLN: 500.0000 mL | Freq: Once | INTRAVENOUS | Status: DC

## 2017-05-29 MED ORDER — OXYTOCIN 40 UNITS IN LACTATED RINGERS INFUSION - SIMPLE MED
1.0000 m[IU]/min | INTRAVENOUS | Status: DC
  Filled 2017-05-29: qty 1000

## 2017-05-29 MED ORDER — EPHEDRINE 5 MG/ML INJ
10.0000 mg | INTRAVENOUS | Status: DC | PRN
  Filled 2017-05-29: qty 2

## 2017-05-29 MED ORDER — SOD CITRATE-CITRIC ACID 500-334 MG/5ML PO SOLN: 30.0000 mL | ORAL | Status: DC | PRN

## 2017-05-29 MED ORDER — FENTANYL 2.5 MCG/ML BUPIVACAINE 1/10 % EPIDURAL INFUSION (WH - ANES)
14.0000 mL/h | INTRAMUSCULAR | Status: DC | PRN
  Administered 2017-05-29: 14 mL/h via EPIDURAL
  Filled 2017-05-29: qty 100

## 2017-05-29 MED ORDER — OXYTOCIN BOLUS FROM INFUSION
500.0000 mL | Freq: Once | INTRAVENOUS | Status: AC
  Administered 2017-05-29: 500 mL via INTRAVENOUS

## 2017-05-29 MED ORDER — FENTANYL CITRATE (PF) 100 MCG/2ML IJ SOLN
100.0000 ug | INTRAMUSCULAR | Status: DC | PRN
  Administered 2017-05-29 (×3): 100 ug via INTRAVENOUS
  Filled 2017-05-29 (×3): qty 2

## 2017-05-29 MED ORDER — FLEET ENEMA 7-19 GM/118ML RE ENEM: 1.0000 | ENEMA | RECTAL | Status: DC | PRN

## 2017-05-29 MED ORDER — OXYCODONE-ACETAMINOPHEN 5-325 MG PO TABS: 2.0000 | ORAL_TABLET | ORAL | Status: DC | PRN

## 2017-05-29 MED ORDER — PHENYLEPHRINE 40 MCG/ML (10ML) SYRINGE FOR IV PUSH (FOR BLOOD PRESSURE SUPPORT)
80.0000 ug | PREFILLED_SYRINGE | INTRAVENOUS | Status: DC | PRN
  Filled 2017-05-29: qty 10
  Filled 2017-05-29: qty 5

## 2017-05-29 MED ORDER — LIDOCAINE HCL (PF) 1 % IJ SOLN
INTRAMUSCULAR | Status: DC | PRN
  Administered 2017-05-29 (×2): 5 mL via EPIDURAL

## 2017-05-29 MED ORDER — LACTATED RINGERS IV SOLN
INTRAVENOUS | Status: DC
  Administered 2017-05-29 (×3): via INTRAVENOUS

## 2017-05-29 MED ORDER — OXYTOCIN 40 UNITS IN LACTATED RINGERS INFUSION - SIMPLE MED
2.5000 [IU]/h | INTRAVENOUS | Status: DC
  Administered 2017-05-30: 2.5 [IU]/h via INTRAVENOUS

## 2017-05-29 MED ORDER — LACTATED RINGERS IV SOLN: 500.0000 mL | INTRAVENOUS | Status: DC | PRN

## 2017-05-29 MED ORDER — MISOPROSTOL 200 MCG PO TABS: 800.0000 ug | ORAL_TABLET | ORAL | Status: DC

## 2017-05-29 MED ORDER — METHYLERGONOVINE MALEATE 0.2 MG/ML IJ SOLN: 0.2000 mg | Freq: Once | INTRAMUSCULAR | Status: DC

## 2017-05-29 MED ORDER — MISOPROSTOL 200 MCG PO TABS: 200.0000 ug | ORAL_TABLET | ORAL | Status: DC

## 2017-05-29 MED ORDER — PHENYLEPHRINE 40 MCG/ML (10ML) SYRINGE FOR IV PUSH (FOR BLOOD PRESSURE SUPPORT)
80.0000 ug | PREFILLED_SYRINGE | INTRAVENOUS | Status: DC | PRN
  Filled 2017-05-29: qty 5
  Filled 2017-05-29: qty 10

## 2017-05-29 MED ORDER — TERBUTALINE SULFATE 1 MG/ML IJ SOLN
0.2500 mg | Freq: Once | INTRAMUSCULAR | Status: DC | PRN
  Filled 2017-05-29: qty 1

## 2017-05-29 MED ORDER — MISOPROSTOL 200 MCG PO TABS
ORAL_TABLET | ORAL | Status: AC
  Administered 2017-05-29: 800 ug via BUCCAL
  Filled 2017-05-29: qty 4

## 2017-05-29 MED ORDER — LIDOCAINE HCL (PF) 1 % IJ SOLN
30.0000 mL | INTRAMUSCULAR | Status: DC | PRN
  Filled 2017-05-29: qty 30

## 2017-05-29 MED ORDER — ACETAMINOPHEN 325 MG PO TABS: 650.0000 mg | ORAL_TABLET | ORAL | Status: DC | PRN

## 2017-05-29 MED ORDER — DIPHENHYDRAMINE HCL 50 MG/ML IJ SOLN: 12.5000 mg | INTRAMUSCULAR | Status: DC | PRN

## 2017-05-29 NOTE — Anesthesia Procedure Notes (Signed)
Epidural Patient location during procedure: OB Start time: 05/29/2017 3:40 PM End time: 05/29/2017 3:49 PM  Staffing Anesthesiologist: Bethena Midgetddono, Vicky Mccanless, MD  Preanesthetic Checklist Completed: patient identified, site marked, surgical consent, pre-op evaluation, timeout performed, IV checked, risks and benefits discussed and monitors and equipment checked  Epidural Patient position: sitting Prep: site prepped and draped and DuraPrep Patient monitoring: continuous pulse ox and blood pressure Approach: midline Location: L4-L5 Injection technique: LOR air  Needle:  Needle type: Tuohy  Needle gauge: 17 G Needle length: 9 cm and 9 Needle insertion depth: 7 cm Catheter type: closed end flexible Catheter size: 19 Gauge Catheter at skin depth: 12 cm Test dose: negative  Assessment Events: blood not aspirated, injection not painful, no injection resistance, negative IV test and no paresthesia

## 2017-05-29 NOTE — Anesthesia Pain Management Evaluation Note (Signed)
  CRNA Pain Management Visit Note  Patient: Christina Weiss, 47 y.o., female  "Hello [facility] am [facility]. We have an anesthesia team available at all times to provide care throughout the hospital, including epidural management and anesthesia for C-section. I don't know your plan for the delivery whether it a natural birth, water birth, IV sedation, nitrous supplementation, doula or epidural, but we want to meet your pain goals."   1.Was your pain managed to your expectations on prior hospitalizations?   Yes   2.What is your expectation for pain management during this hospitalization?     Labor support without medications  3.How can we help you reach that goal? Support prn  Record the patient's initial score and the patient's pain goal.   Pain: 6  Pain Goal: 9 [facility] wants you to be able to say your pain was always managed very well.  [facility],Comfort Iversen 05/29/2017

## 2017-05-29 NOTE — Progress Notes (Signed)
Labor Progress Note  Christina MunroBlanca Y Weiss is a 47 y.o. [phone] at 4364w0d  admitted for induction of labor due to [place].  S: Comfortable with epidural. Denies PIH symptoms.   O:  BP 132/86   Pulse 71   Temp 98 F (36.7 C) (Oral)   Resp 18   Ht 5\' 4"  (1.626 m)   Wt 120.3 kg (265 lb 3.2 oz)   LMP 08/17/2016   BMI 45.52 kg/m   No intake/output data recorded.  FHT:  FHR: 140 bpm, variability: moderate,  accelerations:  Abscent,  decelerations:  Absent UC:   regular, every 1-3 minutes SVE:   Dilation: 6 Effacement (%): 80 Station: -1 Exam by:: k fields, rn  AROM 1620, bloody  Labs: Lab Results  Component Value Date   WBC 6.8 05/29/2017   HGB 11.1 (L) 05/29/2017   HCT 33.1 (L) 05/29/2017   MCV 84.7 05/29/2017   PLT 156 05/29/2017    Assessment / Plan: 47 y.o. [id number] 1364w0d in active labor Induction of labor due to cHTN,  progressing well  Labor: Progressing normally, s/p AROM, augmentation not needed at this time.  HTN: BPs wnl.  Fetal Wellbeing:  Category I Pain Control:  IV pain meds Anticipated MOD:  NSVD  Expectant management   Caryl AdaJazma Sula Fetterly, DO OB Fellow Center for [facility], [facility]

## 2017-05-29 NOTE — Anesthesia Preprocedure Evaluation (Signed)
Anesthesia Evaluation  Patient identified by MRN, date of birth, ID band Patient awake    Reviewed: Allergy & Precautions, H&P , NPO status , Patient's Chart, lab work & pertinent test results, reviewed documented beta blocker date and time   Airway Mallampati: III  TM Distance: >3 FB Neck ROM: full    Dental no notable dental hx.    Pulmonary neg pulmonary ROS,    Pulmonary exam normal breath sounds clear to auscultation       Cardiovascular hypertension, Pt. on medications and Pt. on home beta blockers + Peripheral Vascular Disease  negative cardio ROS Normal cardiovascular exam Rhythm:regular Rate:Normal     Neuro/Psych negative neurological ROS  negative psych ROS   GI/Hepatic negative GI ROS, Neg liver ROS,   Endo/Other  negative endocrine ROSMorbid obesity  Renal/GU negative Renal ROS  negative genitourinary   Musculoskeletal   Abdominal   Peds  Hematology negative hematology ROS (+)   Anesthesia Other Findings   Reproductive/Obstetrics (+) Pregnancy                             Anesthesia Physical Anesthesia Plan  ASA: III  Anesthesia Plan: Epidural   Post-op Pain Management:    Induction:   PONV Risk Score and Plan:   Airway Management Planned:   Additional Equipment:   Intra-op Plan:   Post-operative Plan:   Informed Consent: I have reviewed the patients History and Physical, chart, labs and discussed the procedure including the risks, benefits and alternatives for the proposed anesthesia with the patient or authorized representative who has indicated his/her understanding and acceptance.     Plan Discussed with:   Anesthesia Plan Comments:         Anesthesia Quick Evaluation

## 2017-05-29 NOTE — Progress Notes (Signed)
Labor Progress Note Christina MunroBlanca Y Weiss is a 47 y.o. [id number] at 3042w0d presented for IOL for cHTN. S: No complaints  O:  BP (!) 142/83   Pulse 75   Temp 98 F (36.7 C) (Oral)   Resp 18   Ht 5\' 4"  (1.626 m)   Wt 120.3 kg (265 lb 3.2 oz)   LMP 08/17/2016   BMI 45.52 kg/m  EFM: 140 bpm/mod var/no decels  CVE: Dilation: 6 Effacement (%): 80 Cervical Position: Middle Station: -1 Presentation: Vertex Exam by:: k fields, rn   A&P: 47 y.o. J4N8295G5P3013 6342w0d here for IOL for cHTN. #Labor: contracting every 1 minute without pitocin. Anticipate SVD. #FWB: cat 1 #cHTN: well controlled without anti-hypertensive medications. asymptomatic  Chubb Corporationmber Estephany Perot, DO 8:22 PM

## 2017-05-29 NOTE — H&P (Signed)
Obstetric History and Physical  Christina MunroBlanca Y Weiss is a 47 y.o. [phone] with IUP at 332w0d presenting for IOL for cHTN. Patient states she has been having  Irregular contractions, none vaginal bleeding, intact membranes, with active fetal movement.  No blurry vision, headaches or peripheral edema, and RUQ pain.   Prenatal Course Source of Care: [facility]  with onset of care at 17 weeks Dating: By 6wk US --->  Estimated Date of Delivery: 06/05/17 Pregnancy complications or risks: Patient Active Problem List   Diagnosis Date Noted  . Chronic hypertension affecting pregnancy 05/29/2017  . LGA (large for gestational age) fetus affecting management of mother 05/12/2017  . Maternal obesity, antepartum 12/28/2016  . Language barrier 12/28/2016  . Preexisting hypertension complicating pregnancy, antepartum 12/07/2016  . Advanced maternal age in multigravida, third trimester 12/07/2016  . Supervision of high risk pregnancy, antepartum 12/07/2016   She plans to breastfeed She desires oral progesterone-only contraceptive for postpartum contraception.   Sono:    @[redacted]w[redacted]d , CWD, normal anatomy, cephalic presentation, posterior placenta, 4154g, >90% EFW, AFI normal, BPP 8/8  Prenatal labs and studies: ABO, Rh: --/--/A POS (01/26 0745) Antibody: PENDING (01/26 0745) Rubella:  IMMUNE RPR: Non Reactive (11/05 1439)  HBsAg:   NON-REACTIVE HIV: Non Reactive (11/05 1439)  WGN:FAOZHYQMGBS:Negative (01/09 1529) 2 hr Glucola 91/142/87 Genetic screening normal Anatomy US normal  Prenatal Transfer Tool  Maternal Diabetes: No Genetic Screening: Normal Maternal Ultrasounds/Referrals: Normal Fetal Ultrasounds or other Referrals:  Referred to Materal Fetal Medicine  Maternal Substance Abuse:  No Significant Maternal Medications:  Meds include: Other: Labetalol Significant Maternal Lab Results: None  Past Medical History:  Diagnosis Date  . Cholecystitis, acute with cholelithiasis 06/21/2012  . Hypertension   .  Varicose veins of both lower extremities     Past Surgical History:  Procedure Laterality Date  . ANKLE SURGERY    . CHOLECYSTECTOMY N/A 06/22/2012   Procedure: LAPAROSCOPIC CHOLECYSTECTOMY WITH ATTEMPTED CHOLANGIOGRAM;  Surgeon: Shelly Rubensteinouglas A Blackman, MD;  Location: [facility] OR;  Service: General;  Laterality: N/A;  . DILATION AND CURETTAGE OF UTERUS    . ENDOSCOPIC VEIN LASER TREATMENT      OB History  Gravida Para Term Preterm AB Living  5 3 3  0 1 3  SAB TAB Ectopic Multiple Live Births  0 1 0 0 3    # Outcome Date GA Lbr Len/2nd Weight Sex Delivery Anes PTL Lv  5 Current           4 Term 2005     Vag-Spont     3 Term [id number]     Vag-Spont     2 Term [id number]     Vag-Spont     1 TAB               Social History   Socioeconomic History  . Marital status: Married    Spouse name: None  . Number of children: None  . Years of education: None  . Highest education level: None  Social Needs  . Financial resource strain: None  . Food insecurity - worry: None  . Food insecurity - inability: None  . Transportation needs - medical: None  . Transportation needs - non-medical: None  Occupational History  . None  Tobacco Use  . Smoking status: Never Smoker  . Smokeless tobacco: Never Used  Substance and Sexual Activity  . Alcohol use: No  . Drug use: No  . Sexual activity: Yes  Other Topics Concern  . None  Social History  Narrative  . None    Family History  Problem Relation Age of Onset  . Hypertension Mother   . Varicose Veins Mother   . Asthma Neg Hx   . Arthritis Neg Hx   . Alcohol abuse Neg Hx   . Birth defects Neg Hx   . Cancer Neg Hx   . Depression Neg Hx   . COPD Neg Hx   . Diabetes Neg Hx   . Drug abuse Neg Hx   . Early death Neg Hx   . Hearing loss Neg Hx   . Heart disease Neg Hx   . Hyperlipidemia Neg Hx   . Kidney disease Neg Hx   . Learning disabilities Neg Hx   . Mental illness Neg Hx   . Mental retardation Neg Hx   . Miscarriages / Stillbirths Neg Hx    . Vision loss Neg Hx   . Stroke Neg Hx     Medications Prior to Admission  Medication Sig Dispense Refill Last Dose  . acetaminophen (TYLENOL) 500 MG tablet Take 500 mg by mouth every 6 (six) hours as needed for mild pain.   Not Taking  . aspirin EC 81 MG tablet Take 1 tablet (81 mg total) by mouth daily. 30 tablet 5 Taking  . labetalol (NORMODYNE) 100 MG tablet Take 1 tablet (100 mg total) by mouth 2 (two) times daily. 60 tablet 1 Taking  . Prenatal Vit-Fe Fumarate-FA (PREPLUS) 27-1 MG TABS Take 1 tablet by mouth daily. 30 tablet 13 Taking    No Known Allergies  Review of Systems: Negative except for what is mentioned in HPI.  Physical Exam: Temp 97.8 F (36.6 C)   LMP 08/17/2016  CONSTITUTIONAL: Well-developed, well-nourished female in no acute distress.  HENT:  Normocephalic, atraumatic, External right and left ear normal. Oropharynx is clear and moist EYES: Conjunctivae and EOM are normal. Pupils are equal, round, and reactive to light. No scleral icterus.  NECK: Normal range of motion, supple, no masses SKIN: Skin is warm and dry. No rash noted. Not diaphoretic. No erythema. No pallor. NEUROLOGIC: Alert and oriented to person, place, and time. Normal reflexes, muscle tone coordination. No cranial nerve deficit noted. PSYCHIATRIC: Normal mood and affect. Normal behavior. Normal judgment and thought content. CARDIOVASCULAR: Normal heart rate noted, regular rhythm RESPIRATORY: Effort and breath sounds normal, no problems with respiration noted ABDOMEN: Soft, nontender, nondistended, gravid. MUSCULOSKELETAL: Normal range of motion. No edema and no tenderness. 2+ distal pulses.  Cervical Exam: Dilation: 1.5 Effacement (%): 60 Cervical Position: Posterior Station: -2 Presentation: Vertex Exam by:: Layn Kye  Presentation: cephalic FHT:  Baseline rate 125 bpm   Variability moderate  Accelerations present   Decelerations none Contractions: irregular   Pertinent Labs/Studies:    Results for orders placed or performed during the hospital encounter of 05/29/17 (from the past 24 hour(s))  CBC     Status: Abnormal   Collection Time: 05/29/17  7:45 AM  Result Value Ref Range   WBC 4.8 4.0 - 10.5 K/uL   RBC 3.86 (L) 3.87 - 5.11 MIL/uL   Hemoglobin 10.7 (L) 12.0 - 15.0 g/dL   HCT 16.1 (L) 09.6 - 04.5 %   MCV 84.2 78.0 - 100.0 fL   MCH 27.7 26.0 - 34.0 pg   MCHC 32.9 30.0 - 36.0 g/dL   RDW 40.9 81.1 - 91.4 %   Platelets 165 150 - 400 K/uL  Type and screen     Status: None (Preliminary result)   Collection Time:  05/29/17  7:45 AM  Result Value Ref Range   ABO/RH(D) A POS    Antibody Screen PENDING    Sample Expiration 06/01/2017     Assessment : Christina Weiss is a 47 y.o. [id number] at [redacted]w[redacted]d being admitted for induction of labor due to [place]. Prenatal course complicated by AMA, cHTN, Obesity, and LGA.  Plan: Labor: Induction with cytotec and FB. Will start pitocin after cervical ripening. CHTN: BPs elevated but not in severe range. Stable for patient. No PIH symptoms. Continue to monitor.  Analgesia as needed. Patient wants to labor naturally FWB: Reassuring fetal heart tracing.   GBS negative Delivery plan: Hopeful for vaginal delivery  In-person interpretor used for encounter.   Caryl Ada, DO OB Fellow Faculty Practice, [facility] [place] [facility] 05/29/2017, 8:29 AM

## 2017-05-29 NOTE — Progress Notes (Signed)
Labor Progress Note  Jonna MunroBlanca Y Smola is a 47 y.o. [phone] at 6669w0d  admitted for induction of labor due to [place].  S: Patient starting to be in pain. Denies PIH symptoms.   O:  BP (!) 144/90 (BP Location: Right Arm)   Pulse 70   Temp 98 F (36.7 C) (Oral)   Resp 16   LMP 08/17/2016   No intake/output data recorded.  FHT:  FHR: 135 bpm, variability: moderate,  accelerations:  Present,  decelerations:  Absent UC:   regular, every 1-3 minutes SVE:   Dilation: 4.5 Effacement (%): 80 Station: -1 Exam by:: m wilkins rnc   Labs: Lab Results  Component Value Date   WBC 4.8 05/29/2017   HGB 10.7 (L) 05/29/2017   HCT 32.5 (L) 05/29/2017   MCV 84.2 05/29/2017   PLT 165 05/29/2017    Assessment / Plan: 47 y.o. [id number] 8869w0d in early labor Induction of labor due to cHTN,  progressing well  Labor: Progressing normally, unable to start Pitocin due to frequent contractions, Will AROM to help with augmentation in multip. Fetal Wellbeing:  Category I Pain Control:  IV pain meds Anticipated MOD:  NSVD  Expectant management   Caryl AdaJazma Phelps, DO OB Fellow Center for [facility], [facility]

## 2017-05-30 ENCOUNTER — Encounter (HOSPITAL_COMMUNITY): Payer: Self-pay

## 2017-05-30 LAB — CBC
HEMATOCRIT: 33 % — AB (ref 36.0–46.0)
HEMATOCRIT: 35.6 % — AB (ref 36.0–46.0)
HEMOGLOBIN: 11.1 g/dL — AB (ref 12.0–15.0)
HEMOGLOBIN: 11.8 g/dL — AB (ref 12.0–15.0)
MCH: 28.2 pg (ref 26.0–34.0)
MCH: 28.2 pg (ref 26.0–34.0)
MCHC: 33.1 g/dL (ref 30.0–36.0)
MCHC: 33.6 g/dL (ref 30.0–36.0)
MCV: 84 fL (ref 78.0–100.0)
MCV: 85 fL (ref 78.0–100.0)
Platelets: 142 10*3/uL — ABNORMAL LOW (ref 150–400)
Platelets: 146 10*3/uL — ABNORMAL LOW (ref 150–400)
RBC: 3.93 MIL/uL (ref 3.87–5.11)
RBC: 4.19 MIL/uL (ref 3.87–5.11)
RDW: 14.6 % (ref 11.5–15.5)
RDW: 14.8 % (ref 11.5–15.5)
WBC: 10.2 10*3/uL (ref 4.0–10.5)
WBC: 11.1 10*3/uL — ABNORMAL HIGH (ref 4.0–10.5)

## 2017-05-30 MED ORDER — NALBUPHINE HCL 10 MG/ML IJ SOLN: 5.0000 mg | Freq: Once | INTRAMUSCULAR | Status: DC | PRN

## 2017-05-30 MED ORDER — NALOXONE HCL 0.4 MG/ML IJ SOLN: 0.4000 mg | INTRAMUSCULAR | Status: DC | PRN

## 2017-05-30 MED ORDER — ACETAMINOPHEN 325 MG PO TABS
650.0000 mg | ORAL_TABLET | ORAL | Status: DC | PRN
Start: 2017-05-30 — End: 2017-05-31

## 2017-05-30 MED ORDER — SCOPOLAMINE 1 MG/3DAYS TD PT72
1.0000 | MEDICATED_PATCH | Freq: Once | TRANSDERMAL | Status: DC
  Filled 2017-05-30: qty 1

## 2017-05-30 MED ORDER — NALBUPHINE HCL 10 MG/ML IJ SOLN: 5.0000 mg | INTRAMUSCULAR | Status: DC | PRN

## 2017-05-30 MED ORDER — ONDANSETRON HCL 4 MG/2ML IJ SOLN: 4.0000 mg | Freq: Three times a day (TID) | INTRAMUSCULAR | Status: DC | PRN

## 2017-05-30 MED ORDER — IBUPROFEN 600 MG PO TABS
600.0000 mg | ORAL_TABLET | Freq: Four times a day (QID) | ORAL | Status: DC
  Administered 2017-05-30 – 2017-05-31 (×6): 600 mg via ORAL
  Filled 2017-05-30 (×7): qty 1

## 2017-05-30 MED ORDER — TETANUS-DIPHTH-ACELL PERTUSSIS 5-2.5-18.5 LF-MCG/0.5 IM SUSP: 0.5000 mL | Freq: Once | INTRAMUSCULAR | Status: DC

## 2017-05-30 MED ORDER — WITCH HAZEL-GLYCERIN EX PADS: 1.0000 "application " | MEDICATED_PAD | CUTANEOUS | Status: DC | PRN

## 2017-05-30 MED ORDER — DIPHENHYDRAMINE HCL 50 MG/ML IJ SOLN: 12.5000 mg | INTRAMUSCULAR | Status: DC | PRN

## 2017-05-30 MED ORDER — MEPERIDINE HCL 25 MG/ML IJ SOLN: 6.2500 mg | INTRAMUSCULAR | Status: DC | PRN

## 2017-05-30 MED ORDER — KETOROLAC TROMETHAMINE 30 MG/ML IJ SOLN: 30.0000 mg | Freq: Four times a day (QID) | INTRAMUSCULAR | Status: AC | PRN

## 2017-05-30 MED ORDER — ACETAMINOPHEN 500 MG PO TABS
1000.0000 mg | ORAL_TABLET | Freq: Four times a day (QID) | ORAL | Status: DC | PRN
  Administered 2017-05-30 – 2017-05-31 (×4): 1000 mg via ORAL
  Filled 2017-05-30 (×4): qty 2

## 2017-05-30 MED ORDER — ONDANSETRON HCL 4 MG/2ML IJ SOLN: 4.0000 mg | INTRAMUSCULAR | Status: DC | PRN

## 2017-05-30 MED ORDER — SENNOSIDES-DOCUSATE SODIUM 8.6-50 MG PO TABS
2.0000 | ORAL_TABLET | ORAL | Status: DC
  Administered 2017-05-31: 2 via ORAL
  Filled 2017-05-30: qty 2

## 2017-05-30 MED ORDER — NALBUPHINE HCL 10 MG/ML IJ SOLN
5.0000 mg | Freq: Once | INTRAMUSCULAR | Status: DC | PRN
Start: 2017-05-30 — End: 2017-05-31

## 2017-05-30 MED ORDER — LABETALOL HCL 100 MG PO TABS
100.0000 mg | ORAL_TABLET | Freq: Two times a day (BID) | ORAL | Status: DC
  Administered 2017-05-30 (×2): 100 mg via ORAL
  Filled 2017-05-30 (×2): qty 1

## 2017-05-30 MED ORDER — DIPHENHYDRAMINE HCL 25 MG PO CAPS: 25.0000 mg | ORAL_CAPSULE | ORAL | Status: DC | PRN

## 2017-05-30 MED ORDER — PRENATAL MULTIVITAMIN CH
1.0000 | ORAL_TABLET | Freq: Every day | ORAL | Status: DC
  Administered 2017-05-30 – 2017-05-31 (×2): 1 via ORAL
  Filled 2017-05-30 (×2): qty 1

## 2017-05-30 MED ORDER — DIPHENHYDRAMINE HCL 25 MG PO CAPS: 25.0000 mg | ORAL_CAPSULE | Freq: Four times a day (QID) | ORAL | Status: DC | PRN

## 2017-05-30 MED ORDER — BENZOCAINE-MENTHOL 20-0.5 % EX AERO: 1.0000 "application " | INHALATION_SPRAY | CUTANEOUS | Status: DC | PRN

## 2017-05-30 MED ORDER — DIBUCAINE 1 % RE OINT: 1.0000 "application " | TOPICAL_OINTMENT | RECTAL | Status: DC | PRN

## 2017-05-30 MED ORDER — COCONUT OIL OIL
1.0000 "application " | TOPICAL_OIL | Status: DC | PRN
  Filled 2017-05-30: qty 120

## 2017-05-30 MED ORDER — ONDANSETRON HCL 4 MG PO TABS: 4.0000 mg | ORAL_TABLET | ORAL | Status: DC | PRN

## 2017-05-30 MED ORDER — SODIUM CHLORIDE 0.9% FLUSH: 3.0000 mL | INTRAVENOUS | Status: DC | PRN

## 2017-05-30 MED ORDER — NALOXONE HCL 4 MG/10ML IJ SOLN
1.0000 ug/kg/h | INTRAVENOUS | Status: DC | PRN
  Filled 2017-05-30: qty 5

## 2017-05-30 MED ORDER — SIMETHICONE 80 MG PO CHEW
80.0000 mg | CHEWABLE_TABLET | ORAL | Status: DC | PRN
Start: 2017-05-30 — End: 2017-05-31

## 2017-05-30 MED ORDER — SODIUM CHLORIDE 0.9 % IV BOLUS (SEPSIS): 500.0000 mL | Freq: Once | INTRAVENOUS | Status: DC

## 2017-05-30 NOTE — Anesthesia Postprocedure Evaluation (Signed)
Anesthesia Post Note  Patient: Christina Weiss  Procedure(s) Performed: AN AD HOC LABOR EPIDURAL     Patient location during evaluation: Mother Baby Anesthesia Type: Epidural Level of consciousness: awake and alert, oriented and patient cooperative Pain management: pain level controlled Vital Signs Assessment: post-procedure vital signs reviewed and stable Respiratory status: spontaneous breathing Cardiovascular status: stable Postop Assessment: no headache, epidural receding, patient able to bend at knees and no signs of nausea or vomiting Anesthetic complications: no Comments: Pain score 5.  RN at bedside and need for additional pain medication discussed.     Last Vitals:  Vitals:   05/30/17 0341 05/30/17 0500  BP: (!) 150/64   Pulse: 87   Resp: 18   Temp: 37.9 C 37.7 C  SpO2: 96%     Last Pain:  Vitals:   05/30/17 0500  TempSrc: Oral  PainSc:    Pain Goal:                 [facility],Christina Weiss

## 2017-05-30 NOTE — Progress Notes (Signed)
Post Partum Day 1 Subjective: Patient is doing well this morning. No complaints . Has not slept much since delivery. Denies headache , or dizziness. Eating in drinking well. Mild lochia. Ambulating.   Objective: Blood pressure (!) 150/64, pulse 87, temperature 99.8 F (37.7 C), temperature source Oral, resp. rate 18, height 5\' 4"  (1.626 m), weight 265 lb 3.2 oz (120.3 kg), last menstrual period 08/17/2016, SpO2 96 %, unknown if currently breastfeeding.  Physical Exam:  General: alert, cooperative and appears stated age Lochia: appropriate Uterine Fundus: firm Incision: n/a DVT Evaluation: No evidence of DVT seen on physical exam. Negative Homan's sign. No cords or calf tenderness.  Recent Labs    05/29/17 1518 05/30/17 0035  HGB 11.1* 11.8*  HCT 33.1* 35.6*    Assessment/Plan: Patient is post partum day 1 from SVD with a shoulder dystocia. Patient is doing well  Chronic HTN on labetalol, monitor BP Pain well controlled  Given methergine and cytotec post delivery bleeding controlled.   LOS: 1 day   Maytte Jacot 05/30/2017, 6:52 AM

## 2017-05-30 NOTE — H&P (Signed)
Admitted patient using the [place] interpreter.  Patient speaks very little [place].  Went over admission information and safety information and gave patient all paperwork in spanish. Patient says that she has no questions or concerns at this time.

## 2017-05-30 NOTE — Lactation Note (Signed)
This note was copied from a baby's chart. Lactation Consultation Note  Patient Name: Christina Weiss Date: 05/30/2017 Reason for consult: Initial assessment;Term Visit made with in house interpreter.  Mom is experienced breastfeeding all her previous babies.  Newborn is 6114 hours old.  She reports baby is feeding well but she doesn't know if she is getting milk.  Discussed colostrum.  Instructed to breastfeed with any feeding cue and call for assist prn.  Breastfeeding consultation services and support information given to patient.  Maternal Data Does the patient have breastfeeding experience prior to this delivery?: Yes  Feeding Feeding Type: Breast Fed Length of feed: 15 min  LATCH Score                   Interventions    Lactation Tools Discussed/Used     Consult Status Consult Status: Follow-up Date: 05/31/17 Follow-up type: In-patient    Huston FoleyMOULDEN, Quaneisha Hanisch S 05/30/2017, 2:15 PM

## 2017-05-30 NOTE — Addendum Note (Signed)
Addendum  created 05/30/17 0836 by Angela AdamWrinkle, Rosie Golson G, CRNA   Charge Capture section accepted, Sign clinical note

## 2017-05-30 NOTE — Anesthesia Postprocedure Evaluation (Signed)
Anesthesia Post Note  Patient: Christina Weiss  Procedure(s) Performed: AN AD HOC LABOR EPIDURAL     Patient location during evaluation: Mother Baby Anesthesia Type: Epidural Level of consciousness: awake and alert Pain management: pain level controlled Vital Signs Assessment: post-procedure vital signs reviewed and stable Respiratory status: spontaneous breathing, nonlabored ventilation and respiratory function stable Cardiovascular status: stable Postop Assessment: no headache, no backache and epidural receding Anesthetic complications: no    Last Vitals:  Vitals:   05/30/17 0341 05/30/17 0500  BP: (!) 150/64   Pulse: 87   Resp: 18   Temp: 37.9 C 37.7 C  SpO2: 96%     Last Pain:  Vitals:   05/30/17 0500  TempSrc: Oral  PainSc:    Pain Goal:                 Pelham Hennick

## 2017-05-31 MED ORDER — IBUPROFEN 600 MG PO TABS: 600.0000 mg | ORAL_TABLET | Freq: Four times a day (QID) | ORAL | 0 refills | Status: AC

## 2017-05-31 NOTE — Lactation Note (Signed)
This note was copied from a baby's chart. Lactation Consultation Note  Patient Name: Christina Weiss: 05/31/2017 Reason for consult: Follow-up assessment;Term   Follow up with mom of 37 hour old infant. Spoke with mom via [place] [employer] Interpreter Martha # [phone].  Infant with 7 BF for 20-30 minutes, formula x 1 of 5 cc, 4 voids and 1 stool in the last 24 hours. Infant weight 8 lb 15.2 oz with 4% weight loss since birth. LATCH scores 8-9.   Mom reports BF is going well. She is planning to breast and formula feed at home. Mom reports she is not feeling fuller at this time. Mom reports nipple tenderness with initial latch that improves with feeding. Mom with semi firm breasts with everted nipples. Mom reports she is aware of how to hand express, she is not seeing colostrum at this time. Enc mom to hand express before and after feeding and to apply EBM to nipples post BF.   Enc mom to feed infant STS 8-12 x in 24 hours at first feeding cues. Enc mom to offer breast prior to offering formula. Mom voiced understanding. Reviewed supply and demand, milk coming to volume and engorgement prevention/treatment. Manual pump given with instructions for use and cleaning.   Reviewed I/O, signs of dehydration in the infant and breast milk expression and storage.   Mom reports she has no questions/concerns at this time. She declined need for latch assistance. Infant with follow up with Ped tomorrow and [facility].   [facility] reviewed, mom aware of OP services, BF Support Groups and LC Phone #. Mom to call with questions/concerns prn.   Mom asked about infant's right arm not moving well, information related to Bary RichardSally Hodges Charge RN and Everardo AllSarah Riffey, RN for Dr. Jena GaussHaddix to speak with family.    Maternal Data Formula Feeding for Exclusion: Yes Has patient been taught Hand Expression?: Yes Does the patient have breastfeeding experience prior to this delivery?:  Yes  Feeding Feeding Type: Breast Fed Length of feed: 20 min  LATCH Score Latch: Grasps breast easily, tongue down, lips flanged, rhythmical sucking.  Audible Swallowing: A few with stimulation  Type of Nipple: Everted at rest and after stimulation  Comfort (Breast/Nipple): Filling, red/small blisters or bruises, mild/mod discomfort  Hold (Positioning): No assistance needed to correctly position infant at breast.  LATCH Score: 8  Interventions    Lactation Tools Discussed/Used WIC Program: Yes Pump Review: Setup, frequency, and cleaning;Milk Storage Initiated by:: s Ayumi Wangerin, RN, IBCLC   Consult Status Consult Status: Complete Follow-up type: Call as needed    Christina Weiss 05/31/2017, 1:30 PM

## 2017-05-31 NOTE — Progress Notes (Signed)
Per Rayfield Citizenaroline, midwife, we are to recheck blood pressure at 1500 to determine course of discharge.

## 2017-05-31 NOTE — Discharge Summary (Signed)
OB Discharge Summary     Patient Name: Christina MunroBlanca Y Weiss DOB: 01/19/1971 MRN: [id number]  Date of admission: 05/29/2017 Delivering MD: Rolm BookbinderMOSS, AMBER   Date of discharge: 05/31/2017  Admitting diagnosis: 39 wk induction Intrauterine pregnancy: 3462w0d     Secondary diagnosis:  Active Problems:   Chronic hypertension affecting pregnancy   SVD (spontaneous vaginal delivery)   Vaginal delivery  Additional problems: Advanced maternal Age  Shoulder dystocia  Patient Active Problem List   Diagnosis Date Noted  . SVD (spontaneous vaginal delivery) 05/30/2017  . Vaginal delivery 05/30/2017  . Chronic hypertension affecting pregnancy 05/29/2017  . LGA (large for gestational age) fetus affecting management of mother 05/12/2017  . Maternal obesity, antepartum 12/28/2016  . Language barrier 12/28/2016  . Preexisting hypertension complicating pregnancy, antepartum 12/07/2016  . Advanced maternal age in multigravida, third trimester 12/07/2016  . Supervision of high risk pregnancy, antepartum 12/07/2016      Discharge diagnosis: Term Pregnancy Delivered, CHTN and Shoulder dystocia                                                                                                 Post partum procedures:given methergine and cytotec for continuous trickle   Augmentation: AROM, Pitocin, Cytotec and Foley Balloon  Complications:shoulder dystocia for 20 sec   Hospital course:  Induction of Labor With Vaginal Delivery   [name] was admitted to the hospital 05/29/2017 for induction of labor.  Indication for induction: chronic hypertension.  Patient had an uncomplicated labor course as follows: Membrane Rupture Time/Date: 4:20 PM ,05/29/2017   Intrapartum Procedures: Episiotomy: None [1]                                         Lacerations:  3rd degree [4];Perineal [11]  Patient had delivery of a Viable infant.  Information for the patient's newborn:  Christina Weiss, Girl Christina Weiss  [[id number]][[id number]]  Delivery Method: Vaginal, Spontaneous(Filed from Delivery Summary)  Patient had a 20sec shoulder dystocia and was given methergine and cytotec for a continuous trickle that resolved while in the room post delivery. Patient with elevated temp post cytotec that resolved. Patient was started on her home labetalol after delivery.  05/29/2017  Details of delivery can be found in separate delivery note.  Patient had a routine postpartum course. Patient is discharged home 05/31/17.  Physical exam  Vitals:   05/30/17 0500 05/30/17 0800 05/30/17 1714 05/31/17 0609  BP:  116/79 (!) 144/44 (!) 107/56  Pulse:  74 73 (!) 59  Resp:  20 20 16   Temp: 99.8 F (37.7 C) 98.6 F (37 C) 97.7 F (36.5 C) 98.4 F (36.9 C)  TempSrc: Oral Oral Axillary Oral  SpO2:   97%   Weight:      Height:       General: alert, cooperative and no distress Lochia: appropriate Uterine Fundus: firm Incision: N/A,  DVT Evaluation: No evidence of DVT seen on physical exam. Negative Homan's sign. No cords or calf tenderness.  Labs: Lab Results  Component Value Date   WBC 11.1 (H) 05/30/2017   HGB 11.1 (L) 05/30/2017   HCT 33.0 (L) 05/30/2017   MCV 84.0 05/30/2017   PLT 142 (L) 05/30/2017   CMP Latest Ref Rng & Units 12/28/2016  Glucose 65 - 99 mg/dL 98  BUN 6 - 24 mg/dL 6  Creatinine 1.61 - 0.96 mg/dL 0.45(W)  Sodium 098 - 119 mmol/L 137  Potassium 3.5 - 5.2 mmol/L 4.0  Chloride 96 - 106 mmol/L 102  CO2 20 - 29 mmol/L 19(L)  Calcium 8.7 - 10.2 mg/dL 9.0  Total Protein 6.0 - 8.5 g/dL 7.0  Total Bilirubin 0.0 - 1.2 mg/dL 0.4  Alkaline Phos 39 - 117 IU/L 55  AST 0 - 40 IU/L 17  ALT 0 - 32 IU/L 17    Discharge instruction: per After Visit Summary and "Baby and Me Booklet".  After visit meds:  Allergies as of 05/31/2017   No Known Allergies     Medication List    STOP taking these medications   acetaminophen 500 MG tablet Commonly known as:  TYLENOL   aspirin EC 81 MG tablet     TAKE  these medications   ibuprofen 600 MG tablet Commonly known as:  ADVIL,MOTRIN Take 1 tablet (600 mg total) by mouth every 6 (six) hours.   labetalol 100 MG tablet Commonly known as:  NORMODYNE Take 1 tablet (100 mg total) by mouth 2 (two) times daily.   PREPLUS 27-1 MG Tabs Take 1 tablet by mouth daily.       Diet: routine diet  Activity: Advance as tolerated. Pelvic rest for 6 weeks.   Outpatient follow up:4 weeks  Follow up Appt:No future appointments. Follow up Visit: Follow-up Information    [facility] Schedule an appointment as soon as possible for a visit in 4 week(s).   Specialty:  Obstetrics and Gynecology Contact information: [address] [id number] [address] [address] [phone]         Postpartum contraception: Progesterone only pills  Newborn Data: Live born female  Birth Weight: 9 lb 4.5 oz (4210 g) APGAR: 7, 8  Newborn Delivery   Time head delivered:  05/29/2017 23:34:00 Birth date/time:  05/29/2017 23:34:00 Delivery type:  Vaginal, Spontaneous     Baby Feeding: Breast Disposition:home with mother   05/31/2017 Christina Bridgeman, MD  OB FELLOW DISCHARGE ATTESTATION  I have seen and examined this patient. I agree with above documentation and have made edits as needed.   Caryl Ada, DO

## 2017-06-07 ENCOUNTER — Ambulatory Visit: Payer: Medicaid Other | Admitting: *Deleted

## 2017-06-07 DIAGNOSIS — I10 Essential (primary) hypertension: Secondary | ICD-10-CM | POA: Insufficient documentation

## 2017-06-07 MED ORDER — AMLODIPINE BESYLATE 5 MG PO TABS: 5.0000 mg | ORAL_TABLET | Freq: Every day | ORAL | 0 refills | Status: DC

## 2017-06-07 NOTE — Progress Notes (Addendum)
Here for bp check . C/o headaches. Reviewed elevated bp today , patient c/o , chart with Dr. Erin FullingHarraway-Smith. Orders for norvasc and 1 week fu given. Patient voices understanding. Also instructed go to mau for severe ha.  When pt was questioned with the interpreter she reported a mild HA prev. None currently.  She has take no meds for her HA.  Will begin Norvasc 5mg  po q day and have pt f/u for reports BP in 1 week.   Carolyn L. Harraway-Smith, M.D., Evern CoreFACOG

## 2017-06-14 ENCOUNTER — Ambulatory Visit (INDEPENDENT_AMBULATORY_CARE_PROVIDER_SITE_OTHER): Payer: Medicaid Other | Admitting: *Deleted

## 2017-06-14 VITALS — BP 157/87 | HR 76

## 2017-06-14 DIAGNOSIS — I10 Essential (primary) hypertension: Secondary | ICD-10-CM

## 2017-06-14 NOTE — Progress Notes (Signed)
Here for bp check. Has started norvasc as ordered. Denies headaches. BP reviewed with Dr. Vergie LivingPickens and instructed patient to take 2 tablets of the norvasc 5mg  daily for total of 10 mg daily and to come back in one week for bp check. Informed patient next week will send in new presciption next week once we determine if 10mg  is correct dosage to maintain her bp. She voices understanding.

## 2017-06-21 ENCOUNTER — Ambulatory Visit: Payer: Medicaid Other | Admitting: General Practice

## 2017-06-21 VITALS — BP 137/83 | HR 79 | Ht 64.0 in | Wt 232.0 lb

## 2017-06-21 DIAGNOSIS — Z0130 Encounter for examination of blood pressure without abnormal findings: Secondary | ICD-10-CM

## 2017-06-21 MED ORDER — AMLODIPINE BESYLATE 10 MG PO TABS: 10.0000 mg | ORAL_TABLET | Freq: Every day | ORAL | 1 refills | Status: DC

## 2017-06-21 NOTE — Progress Notes (Signed)
I have reviewed the nursing note and agree with the plan.  Thressa ShellerHeather Brinton Brandel 4:58 PM 06/21/17

## 2017-06-21 NOTE — Progress Notes (Signed)
[name] used for interpreter for today's visit. Patient here for BP check following delivery. Patient denies headaches, dizziness, or blurry vision, +1 edema noted. Patient states she is taking norvasc 5mg  BID daily in the morning. Spoke with Thressa ShellerHeather Hogan who finds BP WNL today. New Rx ordered for patient norvasc 10mg  daily. Patient informed of new Rx and instructions and to follow up at pp visit. Patient verbalized understanding to all & had no questions

## 2017-07-13 ENCOUNTER — Ambulatory Visit (INDEPENDENT_AMBULATORY_CARE_PROVIDER_SITE_OTHER): Payer: Medicaid Other | Admitting: Medical

## 2017-07-13 ENCOUNTER — Encounter: Payer: Self-pay | Admitting: Medical

## 2017-07-13 DIAGNOSIS — Z3009 Encounter for other general counseling and advice on contraception: Secondary | ICD-10-CM | POA: Diagnosis not present

## 2017-07-13 DIAGNOSIS — Z1389 Encounter for screening for other disorder: Secondary | ICD-10-CM

## 2017-07-13 MED ORDER — NORETHINDRONE 0.35 MG PO TABS: 1.0000 | ORAL_TABLET | Freq: Every day | ORAL | 11 refills | Status: DC

## 2017-07-13 NOTE — Progress Notes (Signed)
Subjective:     Jonna MunroBlanca Y Offerdahl is a 47 y.o. female who presents for a postpartum visit. She is 6 weeks postpartum following a spontaneous vaginal delivery. I have fully reviewed the prenatal and intrapartum course. The delivery was at 39 gestational weeks. Outcome: spontaneous vaginal delivery. Anesthesia: epidural. Postpartum course has been normal. Baby's course has been normal. Baby is feeding by both breast and bottle Rush Barer- Gerber. Bleeding no bleeding. Bowel function is normal. Bladder function is normal. Patient is not sexually active. Contraception method is abstinence. Postpartum depression screening: negative.  The following portions of the patient's history were reviewed and updated as appropriate: allergies, current medications, past family history, past medical history, past social history, past surgical history and problem list.  Review of Systems Pertinent items are noted in HPI.   Objective:    BP 116/76   Pulse 71   Wt 238 lb 6.4 oz (108.1 kg)   LMP 08/17/2016   Breastfeeding? Yes Comment: Bottle also  BMI 40.92 kg/m   General:  alert and cooperative   Breasts:  Not performed  Lungs: clear to auscultation bilaterally  Heart:  regular rate and rhythm, S1, S2 normal, no murmur, click, rub or gallop  Abdomen: normal findings: soft, non-tender   Vulva:  not evaluated  Vagina: not evaluated  Cervix:  not evaluated  Corpus: not examined  Adnexa:  not evaluated  Rectal Exam: Not performed.        Assessment:   Normal postpartum exam. Pap smear not done at today's visit. Last pap smear normal 12/2016 Chronic hypertension  Birth Control Counseling   Plan:    1. Contraception: oral progesterone-only contraceptive 2. Follow up in: 3- 4 weeks for BP check or sooner as needed.    Marny LowensteinWenzel, Julie N, PA-C 07/13/2017 9:11 AM

## 2017-07-13 NOTE — Progress Notes (Signed)
Pt interested in Birth Control Pills

## 2017-07-13 NOTE — Patient Instructions (Signed)
Informacin sobre los Civil Service fast streameranticonceptivos orales (Oral Contraception Information) Los anticonceptivos orales (ACO) son medicamentos que se utilizan para Location managerevitar el embarazo. Su funcin es ALLTEL Corporationevitar que los ovarios liberen vulos. Las hormonas de los ACO tambin hacen que el moco cervical se haga ms espeso, lo que evita que el esperma ingrese al tero. Tambin hacen que la membrana que recubre internamente al tero se vuelva ms fina, lo que no permite que el huevo fertilizado se adhiera a la pared del tero. Los ACO son muy efectivos cuando se toman exactamente como se prescriben. Sin embargo, no previenen contra las enfermedades de transmisin sexual (ETS). La prctica del sexo seguro, como el uso de preservativos, junto con la Tutuillapldora, Egyptayudan a prevenir ese tipo de enfermedades. Antes de tomar la pldora, usted debe hacerse un examen fsico y un test de Pap. El mdico podr indicarle anlisis de [place], si es necesario. El mdico se asegurar de que usted sea [place] buena candidata para usar anticonceptivos orales. Converse con su mdico acerca de los posibles efectos secundarios de los ACO que podran recetarle. Cuando se inicia el uso de ACO, puede llevar 2 a 3 meses para que su organismo se adapte a los cambios en los niveles hormonales. TIPOS DE ANTICONCEPTIVOS ORALES  Pldora combinada: esta pldora contiene las hormonas estrgeno y progestina (progesterona sinttica). La pldora combinada viene en envases para [address] das, [address]28 das o [address] das. Algunos tipos de pldoras combinadas deben tomarse de manera continua (pldoras para 365 das). En los envases para [address]21 das, usted no tomar las pldoras durante 7 [address]  [address] despus de la ltima pldora. En los envases para [address]28 das, la pldora se toma CarMaxtodos los das. Las ltimas 7 no contienen hormonas. Ciertos tipos de pldoras tienen ms de 21 pldoras que contienen hormonas. En los envases para [address]91 das, las primeras 84 pldoras contienen ambas hormonas y las ltimas 7 pldoras no  contienen hormonas o contienen slo Cabin crewestrgenos.  La minipldora: esta pldora contiene la hormona progesterona solamente. Es necesario tomarla todos los das de Cookmanera continua. Es importante que las tome a la misma hora todos Inyokernlos das. Viene en envases de 28 pldoras. Las 28 pldoras contienen la hormona. VENTAJAS DE LOS ANTICONCEPTIVOS ORALES  Disminuye los sntomas premenstruales.  Se [place]a para tratar los Best Buyclicos menstruales.  Regula el ciclo menstrual.  Disminuye el ciclo menstrual abundante.  Puede mejorar el acn, segn el tipo de pldora.  Trata hemorragias uterinas anormales.  Trata el sndrome ovrico poliqustico.  Trata la endometriosis.  Pueden usarse como anticonceptivo de Associate Professoremergencia. FACTORES QUE PUEDEN HACER QUE LOS ANTICONCEPTIVOS ORALES SEAN MENOS EFECTIVOS Pueden ser menos efectivos si:  Olvid tomar la J. C. Penneypldora todos los das a la misma hora.  Tiene una enfermedad estomacal o intestinal que disminuye la absorcin de la pldora.  Ingiere simultneamente los anticonceptivos orales junto con otros medicamentos que los hacen menos efectivos, como antibiticos, ciertos medicamentos para el VIH y algunos medicamentos para las convulsiones.  Usted toma anticonceptivos orales que han vencido.  Cuando se [place]a el envase de [place] das, se olvida de recomenzar el uso American Expressen el da 7. RIESGOS ASOCIADOS AL USO DE ANTICONCEPTIVOS ORALES Los anticonceptivos orales pueden en algunos casos causar efectos secundarios como:  Dolor de Turkmenistancabeza.  Nuseas.  Inflamacin mamaria.  Hemorragia vaginal o manchado irregular. Las pldoras combinadas tambin se asocian a un pequeo aumento en el riesgo de:  Cogulos sanguneos.  Ataque cardaco.  Ictus. Esta informacin no tiene Theme park managercomo fin reemplazar el consejo del mdico. Asegrese de  se asocian a un pequeño aumento en el riesgo de:  · Coágulos sanguíneos.  · Ataque cardíaco.  · Ictus.  Esta información no tiene como fin reemplazar el consejo del médico. Asegúrese de hacerle al médico cualquier pregunta que tenga.  Document Released: 01/28/2005 Document Revised: 08/12/2015 Document Reviewed: 10/09/2012   Elsevier Interactive Patient Education © 2018 Elsevier Inc.

## 2017-08-11 ENCOUNTER — Ambulatory Visit (INDEPENDENT_AMBULATORY_CARE_PROVIDER_SITE_OTHER): Payer: Medicaid Other | Admitting: General Practice

## 2017-08-11 VITALS — BP 143/84 | HR 67 | Ht 64.0 in | Wt 239.0 lb

## 2017-08-11 DIAGNOSIS — Z0130 Encounter for examination of blood pressure without abnormal findings: Secondary | ICD-10-CM

## 2017-08-11 NOTE — Progress Notes (Signed)
Chart reviewed for nurse visit. Agree with plan of care.   Neill, Caroline M, CNM 08/11/2017 1:33 PM   

## 2017-08-11 NOTE — Progress Notes (Signed)
Patient here for BP check today following postpartum visit a month ago. Patient denies headaches, dizziness, or swelling. Patient reports off/on blurry vision for past 3 weeks. BP is slightly elevated. Reviewed with Cleone Slimaroline Neill who recommends patient follow up with PCP for further BP management. Reviewed with patient. She verbalized understanding & had no questions.

## 2017-08-13 IMAGING — US US OB TRANSVAGINAL
1 series · 15 of 28 positions shown · non-contrast
Comparison: 09/28/2016

CLINICAL DATA: Pregnant, for viability

EXAM:
TRANSVAGINAL OB ULTRASOUND
TECHNIQUE: Transvaginal ultrasound was performed for complete evaluation of the
gestation as well as the maternal uterus, adnexal regions, and
pelvic cul-de-sac.

[Series 1: us ob transvaginal · 36 acquisitions, 15 frames shown]
[im 1/36]
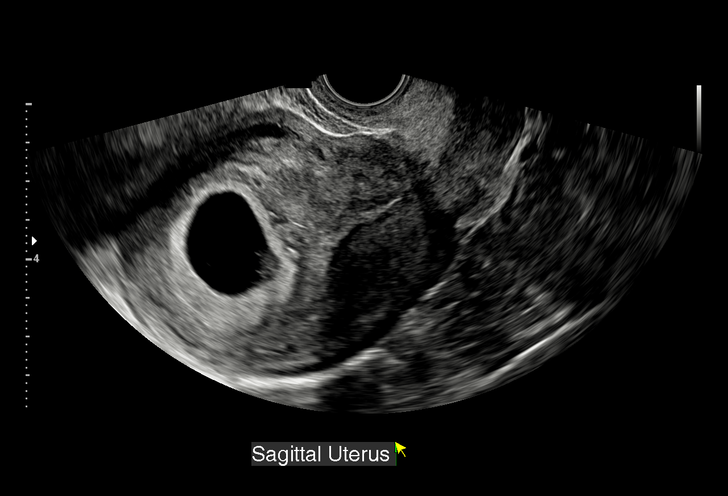
[im 3/36]
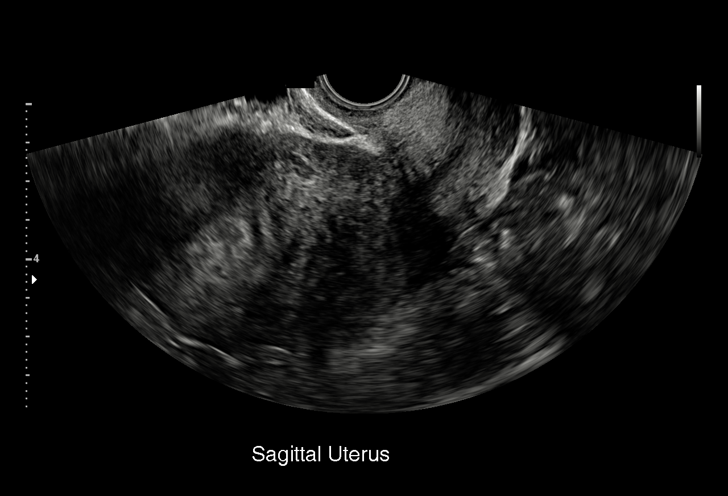
[im 6/36]
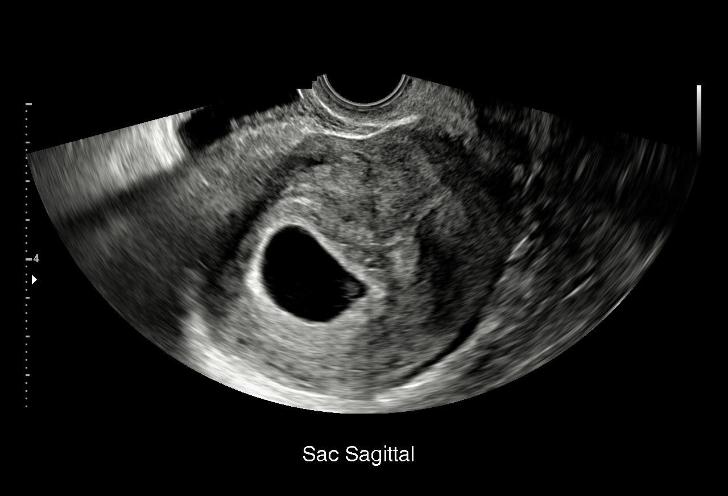
[im 8/36]
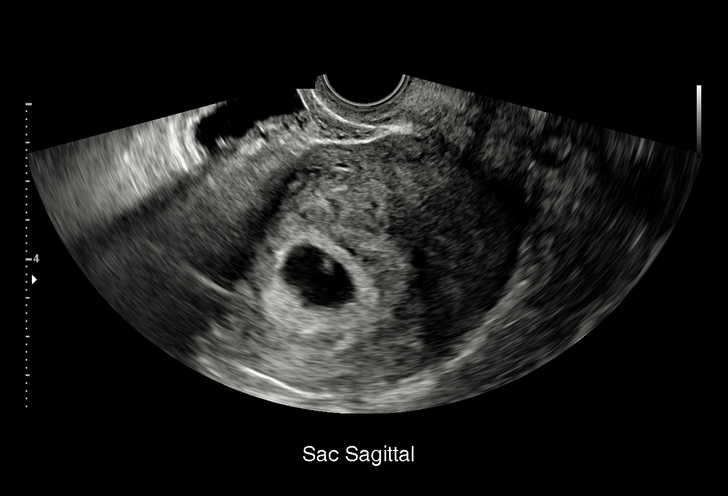
[im 11/36]
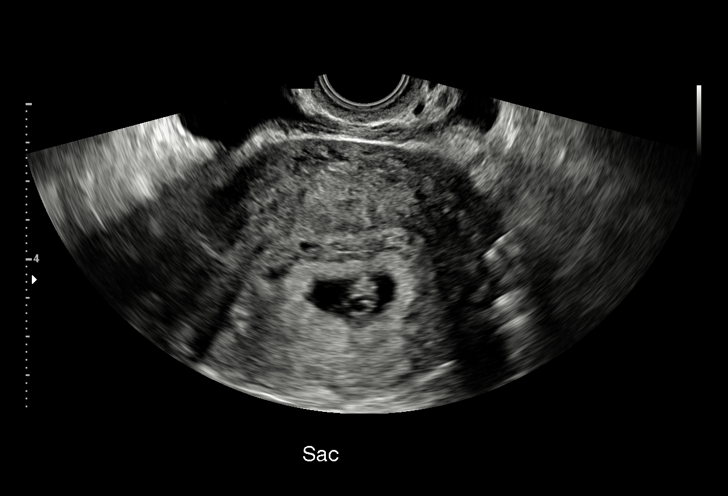
[im 13/36]
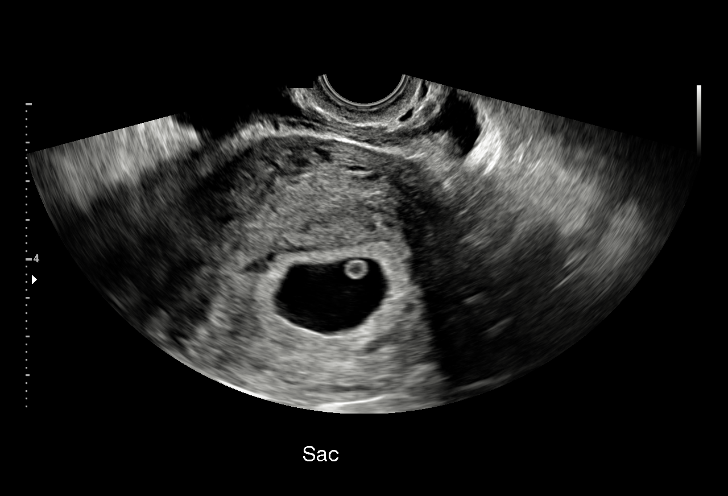
[im 16/36]
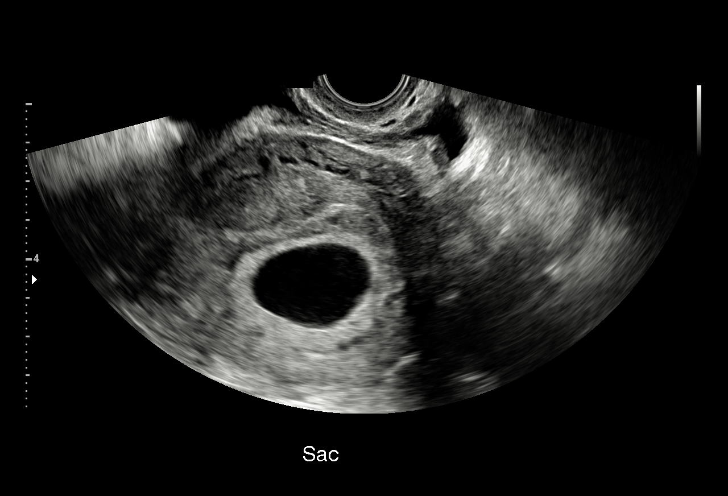
[im 19/36]
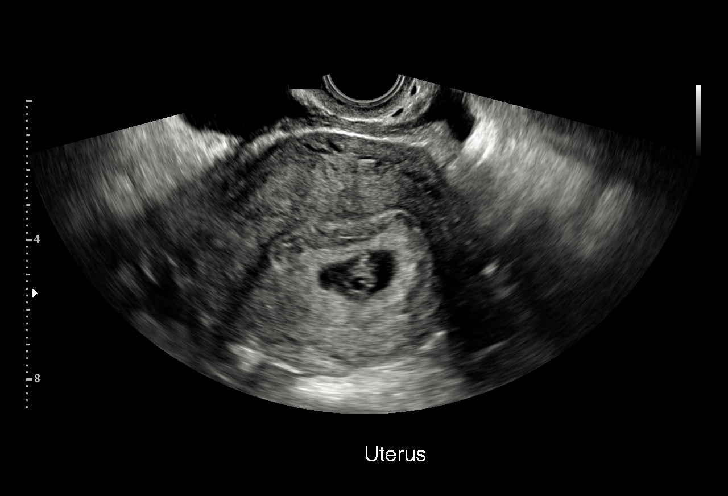
[im 20/36]
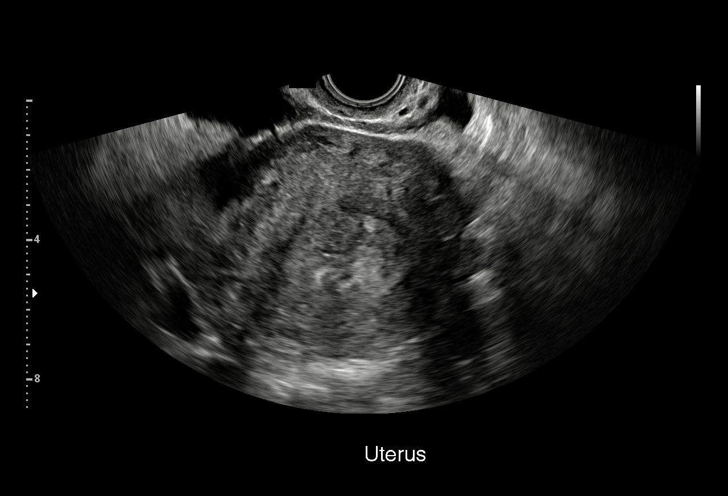
[im 23/36]
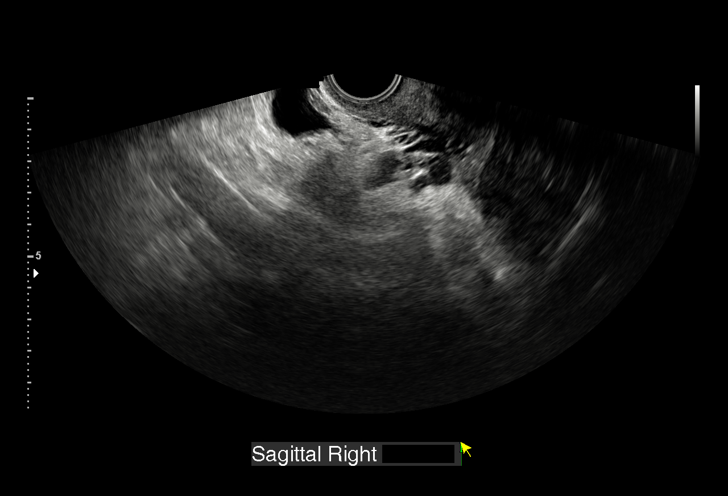
[im 25/36]
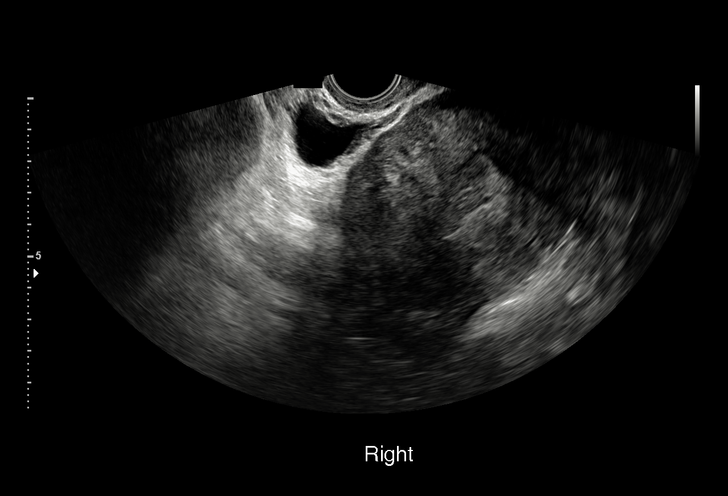
[im 28/36]
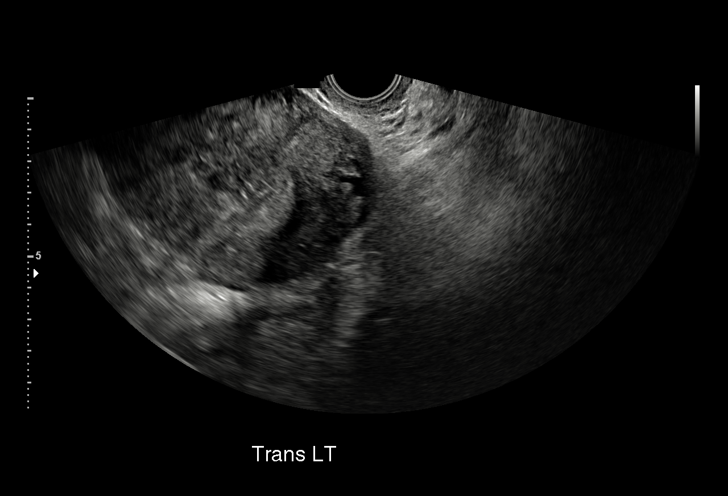
[im 30/36]
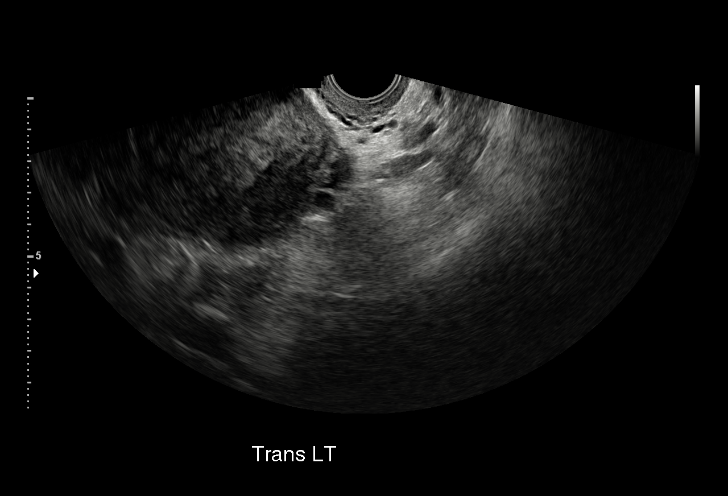
[im 33/36]
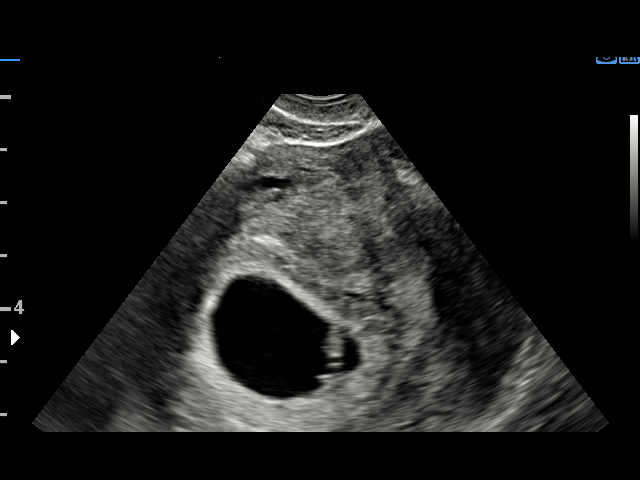
[im 36/36]
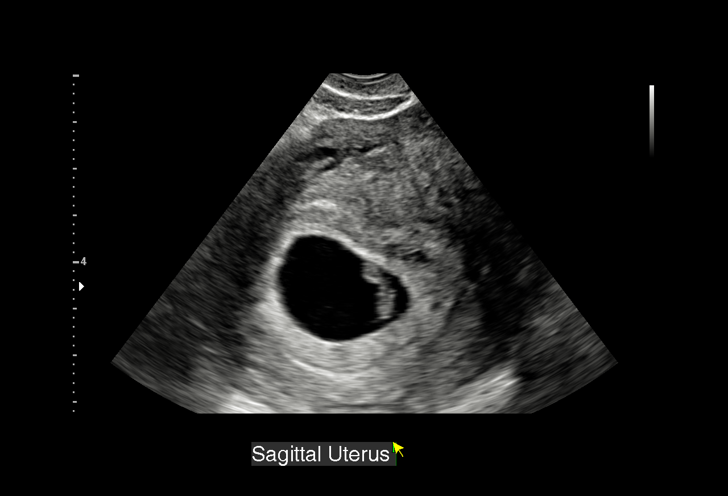

[15 of 28 positions shown; findings below may reference images not displayed]

FINDINGS: Intrauterine gestational sac: Single

Yolk sac:  Visualized.

Embryo:  Visualized.

Cardiac Activity: Visualized.

Heart Rate: 157 bpm

CRL:   8.7  mm   6 w 5 d                  US EDC: 06/05/2017

Subchorionic hemorrhage:  None visualized.

Maternal uterus/adnexae: Bilateral ovaries are not discretely
visualized.

No free fluid.
IMPRESSION: Single live intrauterine gestation, with estimated gestational age 6
weeks 5 days by crown-rump length.

## 2017-10-10 ENCOUNTER — Other Ambulatory Visit: Payer: Self-pay | Admitting: Advanced Practice Midwife

## 2018-03-10 IMAGING — US US FETAL BPP W/ NON-STRESS
1 series · 13 of 14 positions shown · non-contrast
Comparison: none

[Series 1: us fetal bpp w/nonstress · 14 acquisitions, 13 frames shown]
[im 1/14]
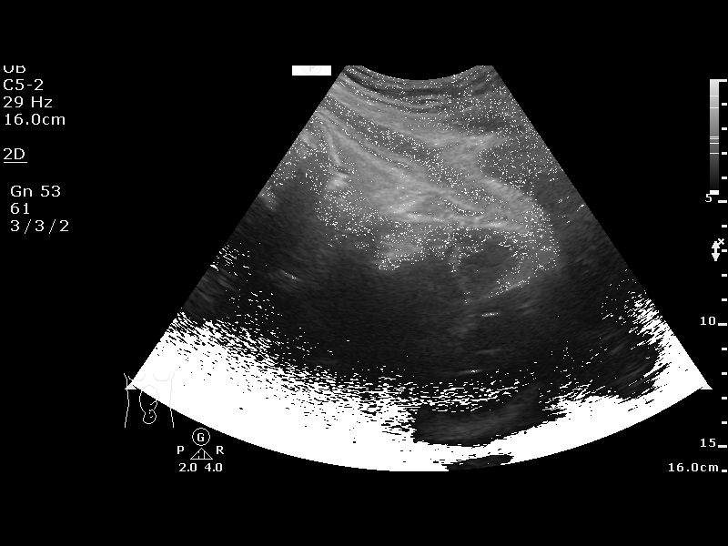
[im 2/14]
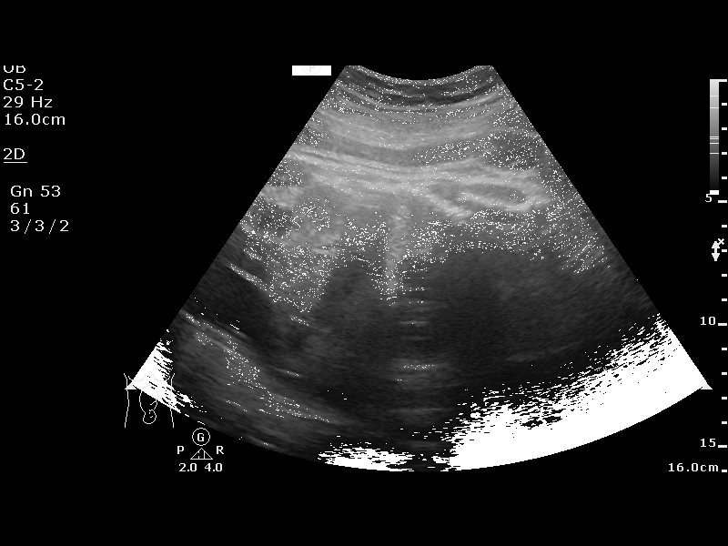
[im 3/14]
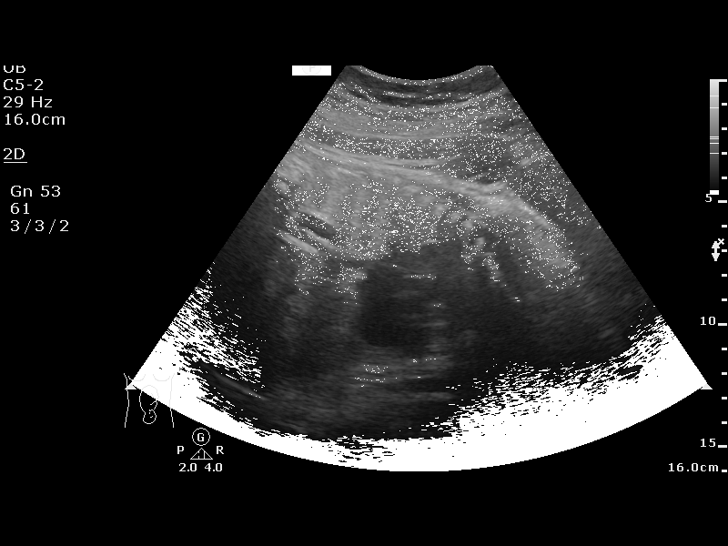
[im 4/14]
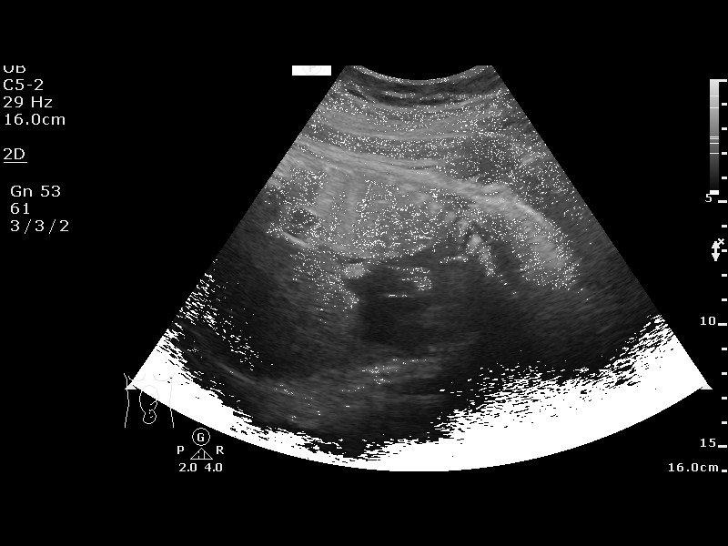
[im 5/14]
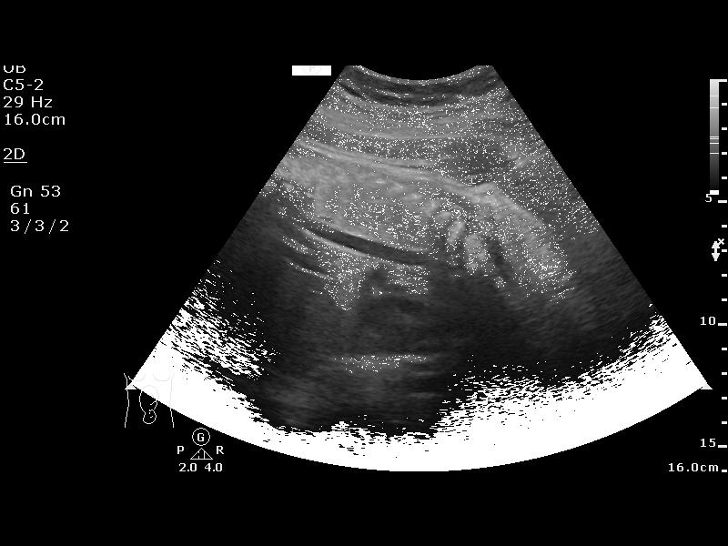
[im 6/14]
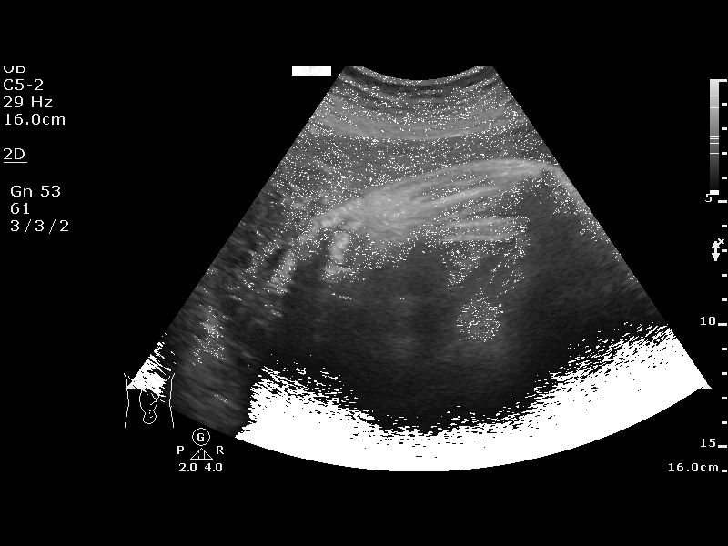
[im 8/14]
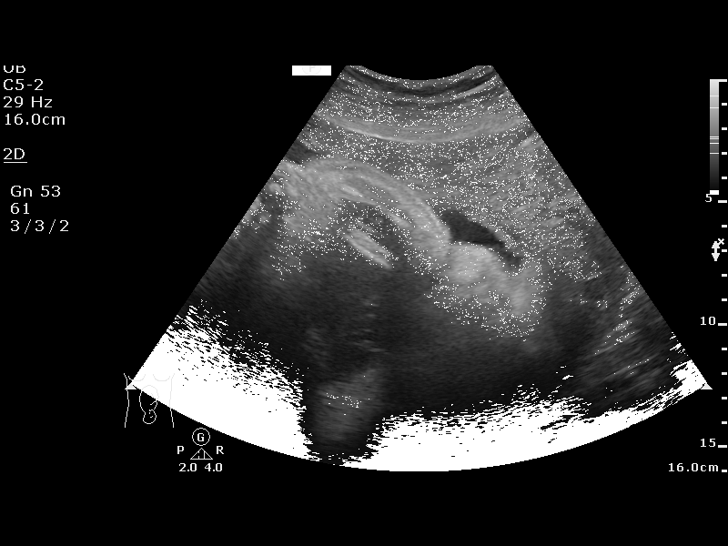
[im 9/14]
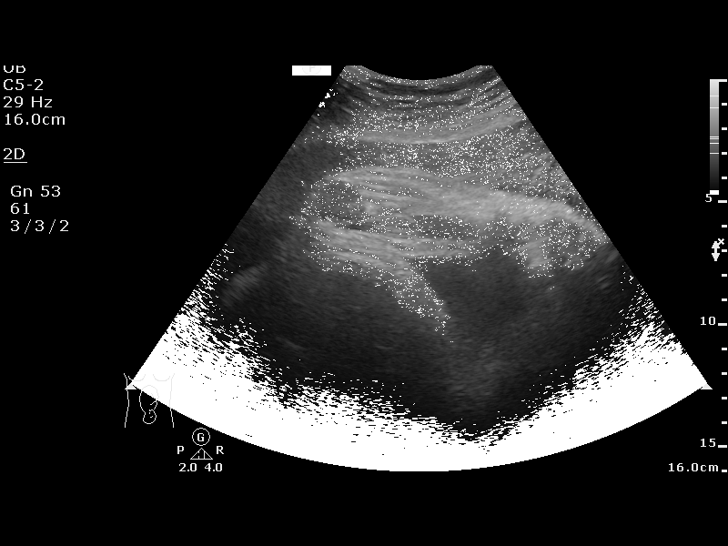
[im 10/14]
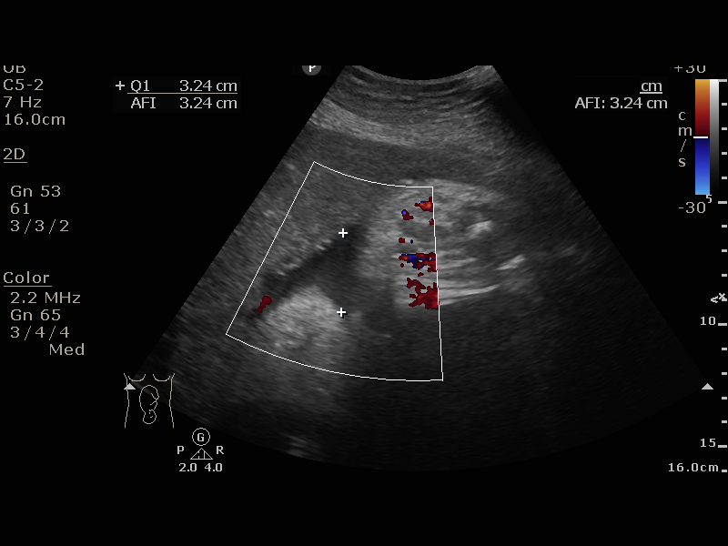
[im 11/14]
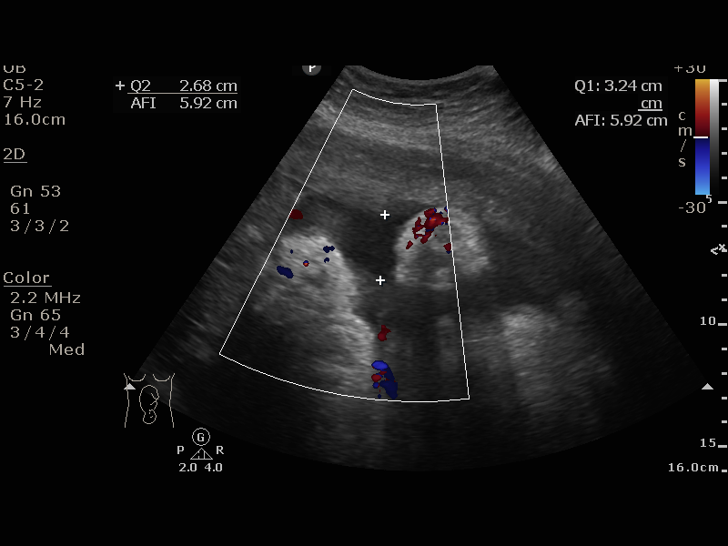
[im 12/14]
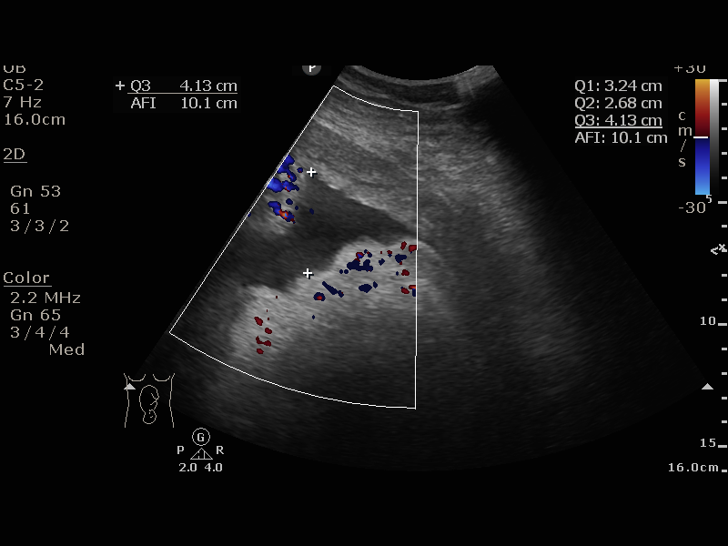
[im 13/14]
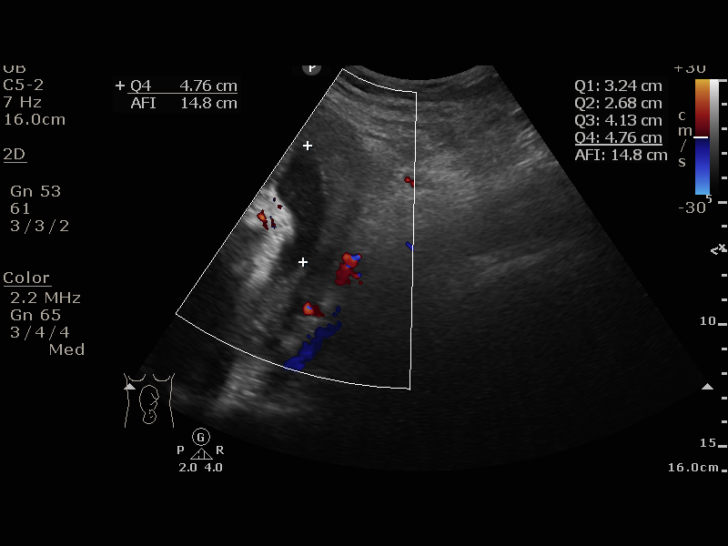
[im 14/14]
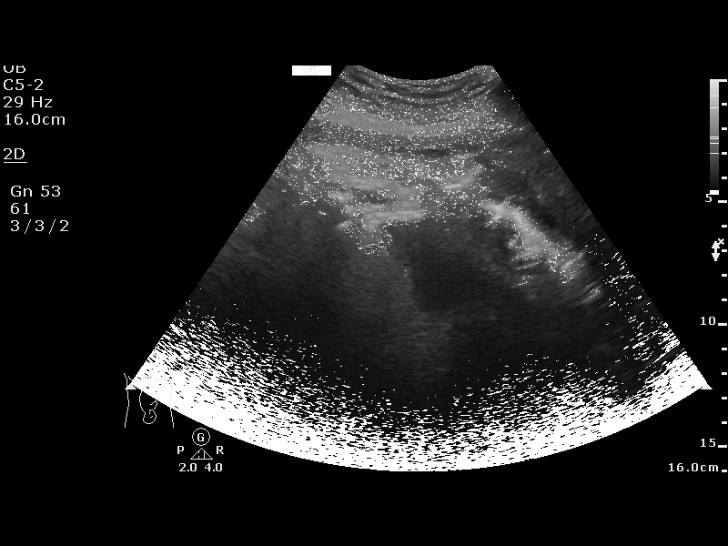

[13 of 14 positions shown; findings below may reference images not displayed]

[facility] Clinic
FANNAMI NP                              [facility]
[REDACTED]
Ref. Address:     Faculty Practice

1  US FETAL BPP W/NONSTRESS                    [address].4

1  DR-MANAN BUILDWARE          [id number]      [id number]     [id number]
Service(s) Provided

Indications

36 weeks gestation of pregnancy
Advanced maternal age multigravida 35+,
third trimester
Unspecified pre-existing hypertension
complicating pregnancy, third trimester
OB History

Gravidity:    5         Term:   3
TOP:          1        Living:  3
Fetal Evaluation

Num Of Fetuses:     1
Preg. Location:     Intrauterine
Cardiac Activity:   Observed
Presentation:       Cephalic

Amniotic Fluid
AFI FV:      Subjectively within normal limits
AFI Sum(cm)     %Tile       Largest Pocket(cm)
14.81           55

RUQ(cm)       RLQ(cm)       LUQ(cm)        LLQ(cm)
3.24
Biophysical Evaluation

Amniotic F.V:   Pocket => 2 cm two         F. Tone:        Observed
planes
F. Movement:    Observed                   N.S.T:          Reactive
F. Breathing:   Observed                   Score:          [DATE]
Gestational Age

LMP:           38w 2d        Date:  08/17/16                 EDD:   05/24/17
Best:          36w 4d     Det. By:  Early Ultrasound         EDD:   06/05/17
(10/15/16)
Comments

Technically limited exam due to maternal body habitus and
advanced gestational age.
Recommendations

Continue AP testing

## 2018-03-17 IMAGING — US US FETAL BPP W/ NON-STRESS
1 series · 13 of 13 positions shown · non-contrast
Comparison: none

[Series 1: us fetal bpp w/nonstress · 13 acquisitions, 13 frames shown]
[im 1/13]
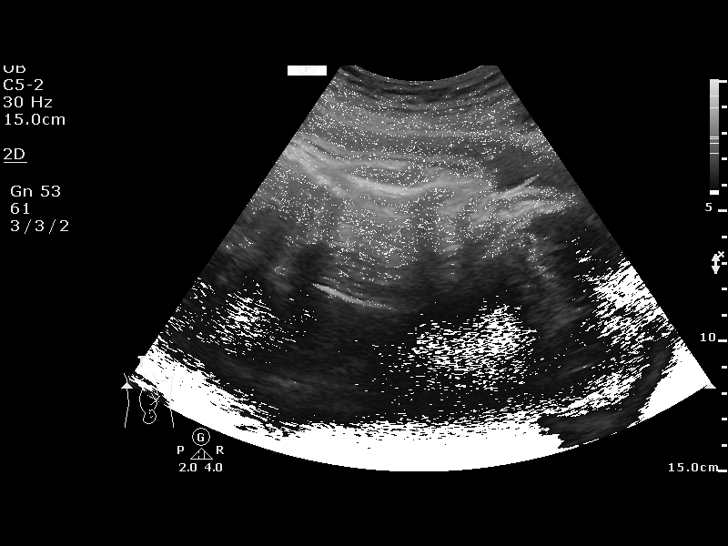
[im 2/13]
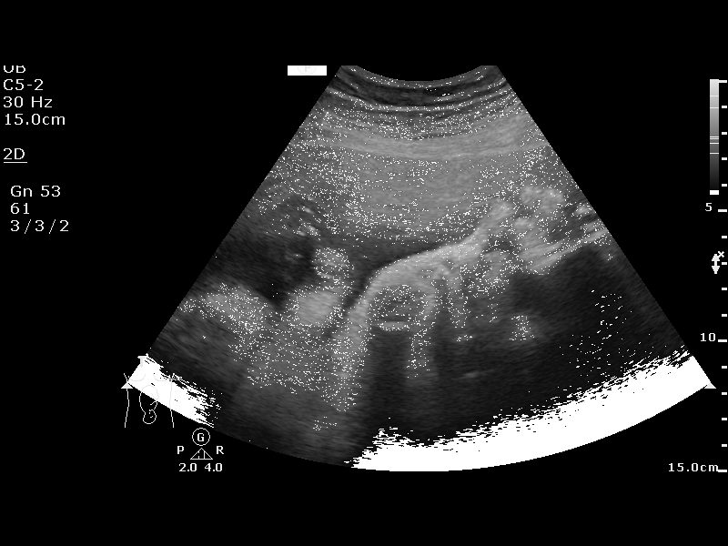
[im 3/13]
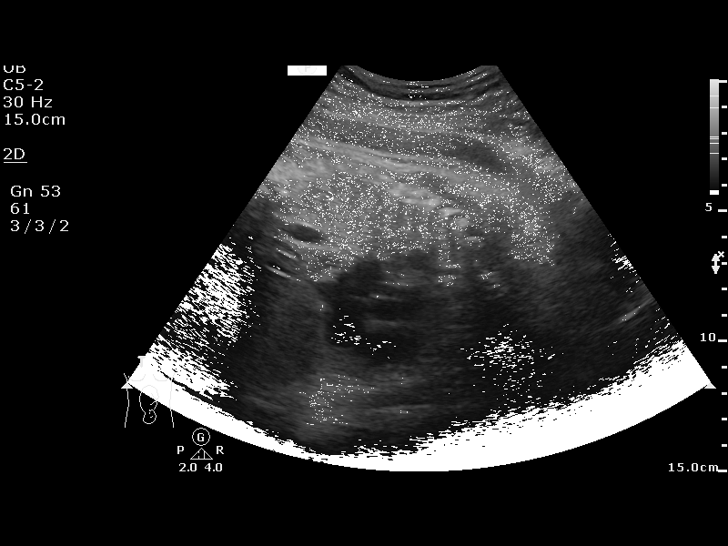
[im 4/13]
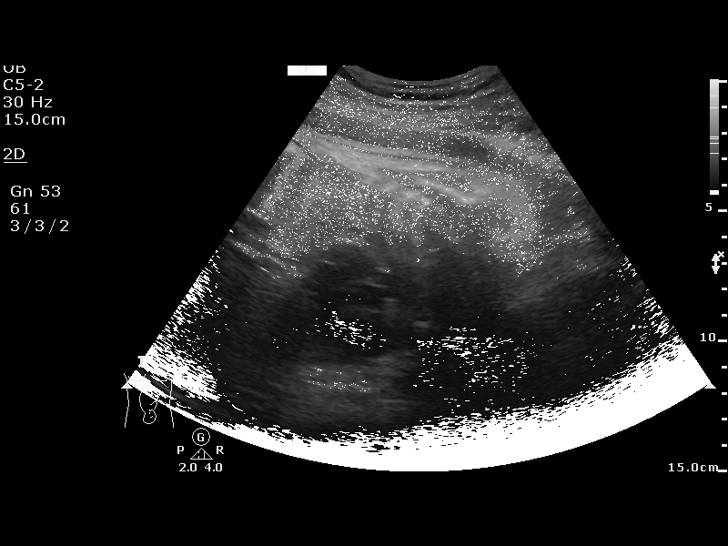
[im 5/13]
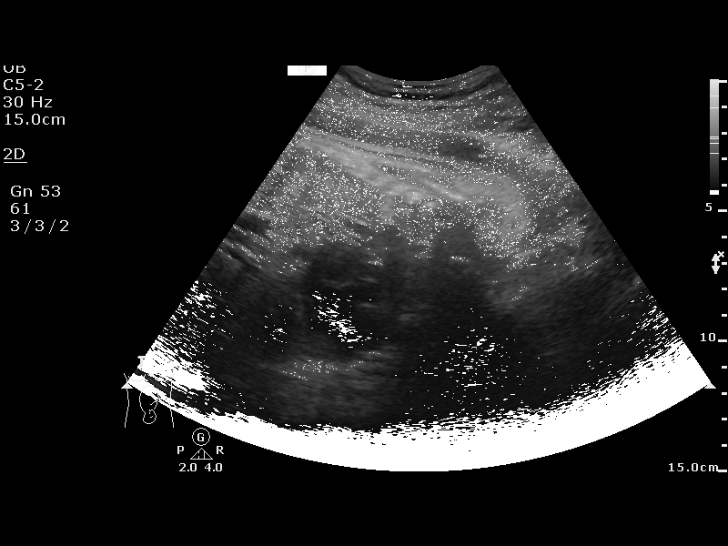
[im 6/13]
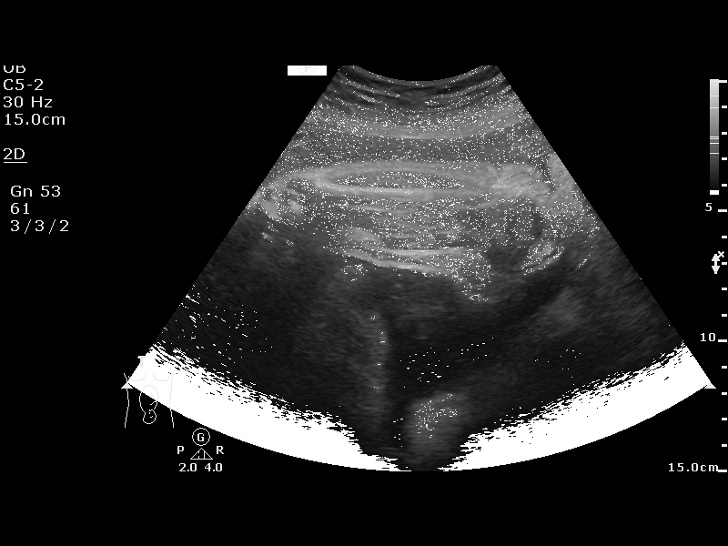
[im 7/13]
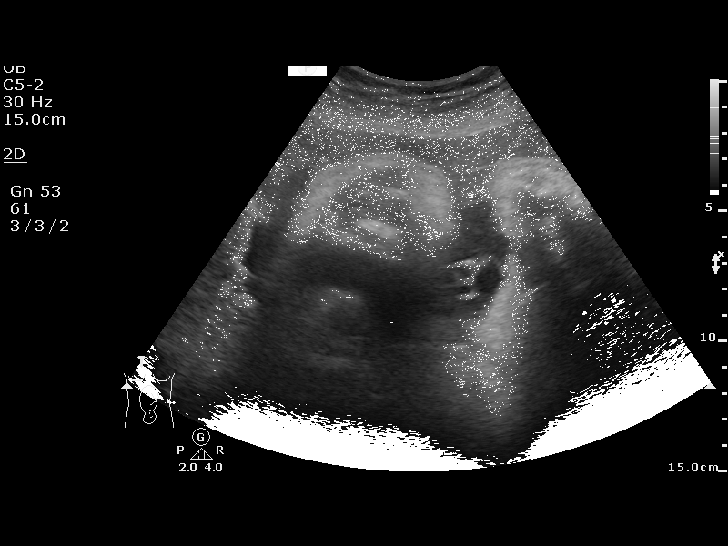
[im 8/13]
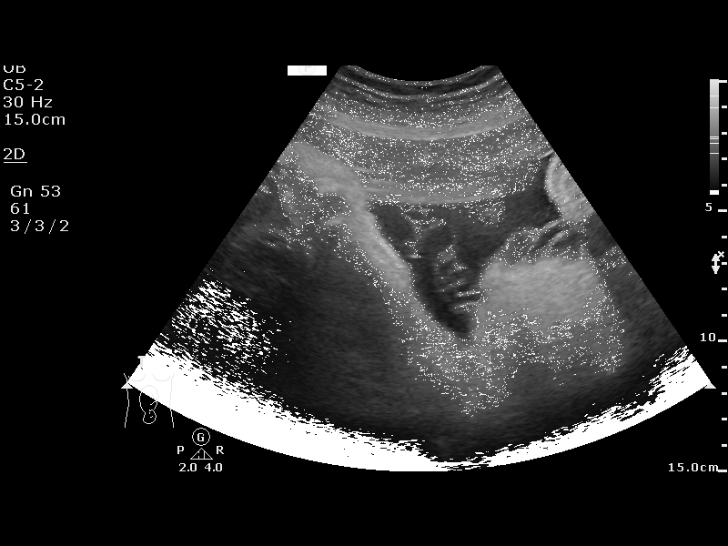
[im 9/13]
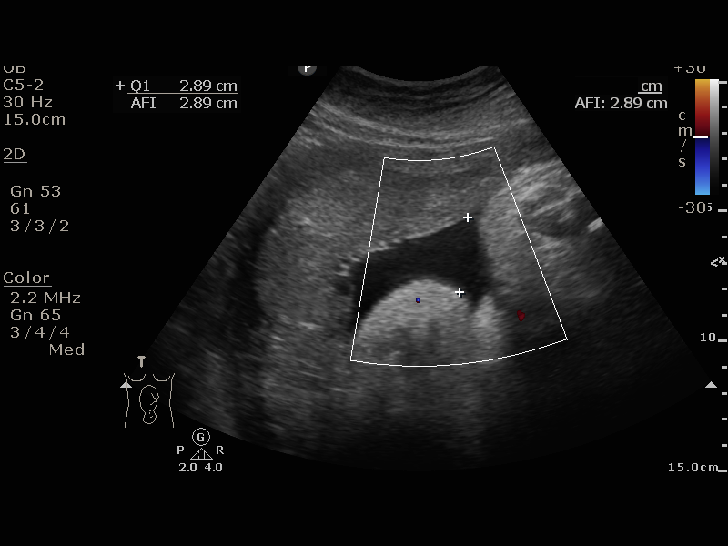
[im 10/13]
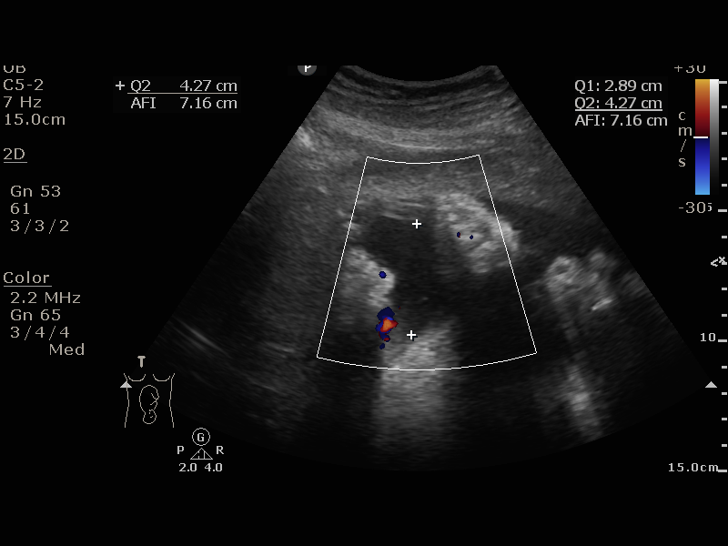
[im 11/13]
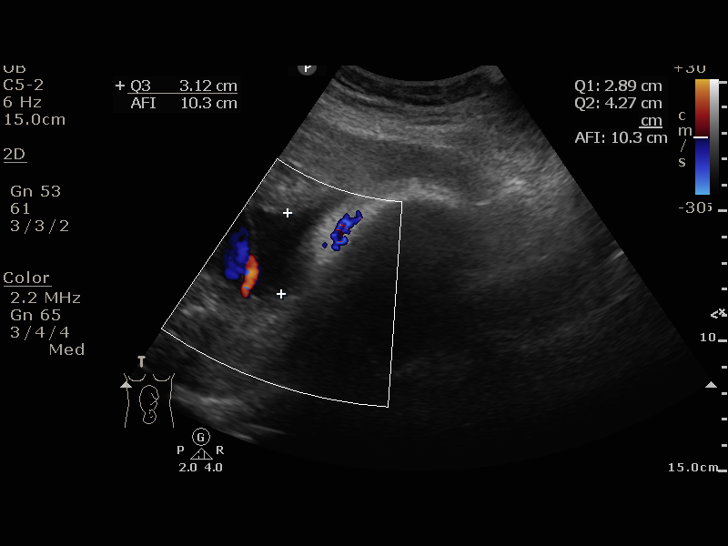
[im 12/13]
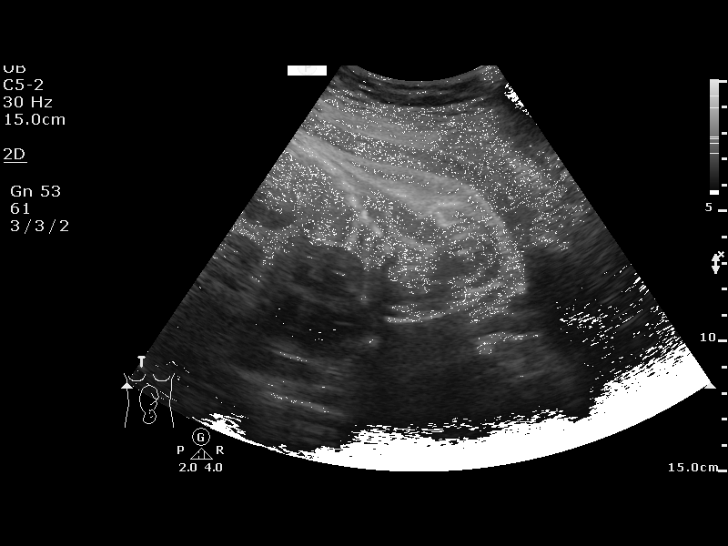
[im 13/13]
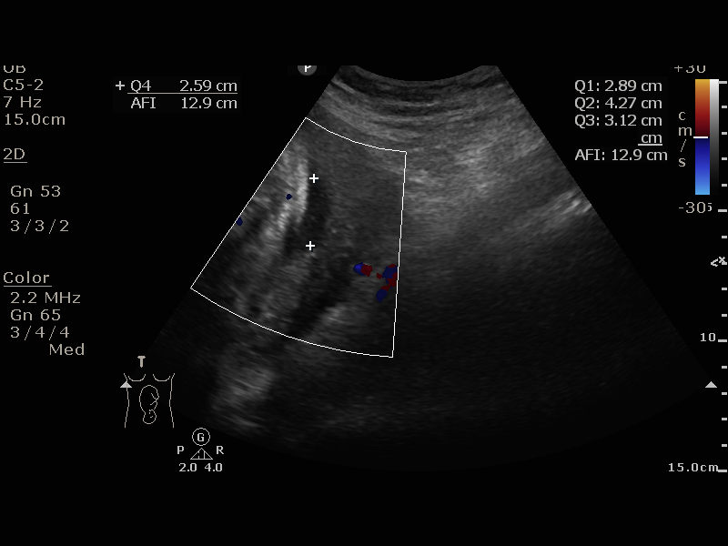

[13 of 13 positions shown; findings below may reference images not displayed]

OB/Gyn Clinic
BASTL NP                              [facility]
[REDACTED]
Ref. Address:     Faculty Practice

1  US FETAL BPP W/NONSTRESS                    [address].4

1  MUHAMMAD IBRAHIM PAM              [id number]      [phone]     [id number]
Service(s) Provided

Indications

37 weeks gestation of pregnancy
Unspecified pre-existing hypertension
complicating pregnancy, third trimester
Advanced maternal age multigravida 35+,
third trimester
OB History

Gravidity:    5         Term:   3
TOP:          1        Living:  3
Fetal Evaluation

Num Of Fetuses:     1
Preg. Location:     Intrauterine
Cardiac Activity:   Observed
Presentation:       Cephalic

Amniotic Fluid
AFI FV:      Subjectively within normal limits
AFI Sum(cm)     %Tile       Largest Pocket(cm)
12.87           47

RUQ(cm)       RLQ(cm)       LUQ(cm)        LLQ(cm)
2.89
Biophysical Evaluation

Amniotic F.V:   Pocket => 2 cm two         F. Tone:        Observed
planes
F. Movement:    Observed                   N.S.T:          Reactive
F. Breathing:   Observed                   Score:          [DATE]
Gestational Age

LMP:           39w 2d        Date:  08/17/16                 EDD:   05/24/17
Best:          37w 4d     Det. By:  Early Ultrasound         EDD:   06/05/17
(10/15/16)
Impression

Recommendations

Cont weekly testing

## 2018-06-08 ENCOUNTER — Encounter: Payer: Self-pay | Admitting: Internal Medicine

## 2018-06-08 ENCOUNTER — Ambulatory Visit: Payer: Medicaid Other | Admitting: Internal Medicine

## 2018-06-08 VITALS — BP 136/80 | HR 82 | Resp 12 | Ht 63.0 in | Wt 252.0 lb

## 2018-06-08 DIAGNOSIS — M25561 Pain in right knee: Secondary | ICD-10-CM | POA: Diagnosis not present

## 2018-06-08 DIAGNOSIS — M25562 Pain in left knee: Secondary | ICD-10-CM

## 2018-06-08 DIAGNOSIS — M25521 Pain in right elbow: Secondary | ICD-10-CM

## 2018-06-08 DIAGNOSIS — G8929 Other chronic pain: Secondary | ICD-10-CM

## 2018-06-08 DIAGNOSIS — M25522 Pain in left elbow: Secondary | ICD-10-CM

## 2018-06-08 DIAGNOSIS — I8311 Varicose veins of right lower extremity with inflammation: Secondary | ICD-10-CM

## 2018-06-08 DIAGNOSIS — I10 Essential (primary) hypertension: Secondary | ICD-10-CM | POA: Diagnosis not present

## 2018-06-08 DIAGNOSIS — I8312 Varicose veins of left lower extremity with inflammation: Secondary | ICD-10-CM

## 2018-06-08 DIAGNOSIS — I872 Venous insufficiency (chronic) (peripheral): Secondary | ICD-10-CM

## 2018-06-08 DIAGNOSIS — Z79899 Other long term (current) drug therapy: Secondary | ICD-10-CM

## 2018-06-08 MED ORDER — AMLODIPINE BESYLATE 10 MG PO TABS: 10.0000 mg | ORAL_TABLET | Freq: Every day | ORAL | 11 refills | Status: AC

## 2018-06-08 MED ORDER — MELOXICAM 7.5 MG PO TABS: ORAL_TABLET | ORAL | 4 refills | Status: AC

## 2018-06-08 NOTE — Progress Notes (Signed)
Social work Barrister's clerk completed new patient screening to assess for mental health symptoms and social determinants of health. Patient reported that she is experiencing some fatigue and stress. She denied any suicidal thoughts, and denied having any issue with social determinants of health.

## 2018-06-08 NOTE — Progress Notes (Signed)
Subjective:    Patient ID: Christina Weiss, female   DOB: 08-09-1970, 48 y.o.   MRN: [id number]   HPI   Here to establish Interpretation by adult daughter, also a patient, Christina Weiss  1.  Pain in soft tissue medial elbows bilaterally.  Severe pain started in 03/2018, but had similar pain 5 years ago.  Was treated with unknown medication then, but had to stop the medication as she became pregnant. Took the medication once daily.  Maybe Meloxicam. Ibuprofen does not help.  Was told she had arthritis or the beginning of arthritis.   She has not received any PT for her areas of pain.  Her PIP joints hurt as well with flexion.  She states the joints sometimes turn red and swell.    No rashes.    2.  Bilateral knee Pain:  Points mainly to soft tissue at superiolateral aspects of bilateral knees.  Unable to sit for prolonged period of time or squat, perform deep knee bending.  Pain worsens the longer she walks as well. Has redness and swelling of knees at times. Has had this pain for over a year.  Right is much worse than left. No history of injury for any of her joint area pain.    3.  Essential Hypertension:  Diagnosed 6 years ago.  Taking Amlodipine 10 mg daily.  4.  Obesity:  Has been overweight for over 20 years.    First oral intake:  After arising at 8 am, she eats breakfast at 10 am:   Frosted Flakes with one banana. Uses 2% milk on cereal and drinks remainder. Water  Noon:  Pork or beef or chicken with rice with a Coke and 4 corn tortillas, not small.  6 p.m.:  Often similar to noon meal with water.  May have a banana or apple throughout the day.  5.  Venous insuffiency of LE:  Has one pair of knee high compression stockings with open toe.  The thigh highs did not work for her as they just roll down.     Current Meds  Medication Sig  . amLODipine (NORVASC) 10 MG tablet Take 1 tablet (10 mg total) by mouth daily.  Marland Kitchen ibuprofen (ADVIL,MOTRIN) 600 MG  tablet Take 1 tablet (600 mg total) by mouth every 6 (six) hours.   No Known Allergies   Review of Systems    Objective:   BP 136/80 (BP Location: Left Arm, Patient Position: Sitting, Cuff Size: Large)   Pulse 82   Resp 12   Ht 5\' 3"  (1.6 m)   Wt 252 lb (114.3 kg)   LMP 05/09/2018   Breastfeeding Yes   BMI 44.64 kg/m   Physical Exam  NAD HEENT:  PERRL, EOMI, Discs sharp bilaterally.  TMs pearly gray, throat without injection. Neck:  Supple, No adenopathy, no thyromegaly Chest:  CTA CV:  RRR with normal S1 and S2, No S3, S4 or murmur.  No carotid bruit.  Carotid, radial and DP pulses normal and equal. Abd:  S, NT, No HSM or mass, + BS.  Large Pendulous abdomen MS:  Tender over medial epicondyles bilaterally.   LE:  Thickened browned and flaking skin of medial ankles and laterally on right.  Mild underlying erythema.  signficant varicosities of bilateral legs throughout.  Scattered surgical scars of legs. Assessment & Plan   1.  Hypertension:  She is stopping breast feeding--currently only at night and very little. Amlodipine 5 mg daily.  2.  Morbid  Obesity:  Some discussion of diet and gradual increase of physical activity.  FLP, CBC, CMP  3.  Bilateral Medial Epicondylitis:  Air cast velcro arm splints during the day.  4.  Bilateral knee pain:  Consider injection after review of xrays and if pain not controlled with Meloxicam.  5.  Varicosities:  Elevate legs when sitting.  Move if on feet.  Graduated compression stocking information  6.  Venous statis dermatitis:  As above with god hydrating cream such as Eucerin.   Restrict sodium intake Watch for worsening edema with Amlodipine.Marland Kitchen

## 2018-06-08 NOTE — Patient Instructions (Addendum)
Tome un vaso de agua antes de cada comida Tome un minimo de 6 a 8 vasos de agua diarios Coma tres veces al dia Coma una proteina y Neomia Dear grasa saludable con comida.  (huevos, pescado, pollo, pavo, y limite carnes rojas Coma 5 porciones diarias de legumbres.  Mezcle los colores Coma 2 porciones diarias de frutas con cascara cuando sea comestible Use platos pequeos Suelte su tenedor o cuchara despues de cada mordida hata que se mastique y se trague Come en la mesa con amigos o familiares por lo menos una vez al dia Apague la televisin y aparatos electrnicos durante la comida  Su objetivo debe ser perder una libra por semana  Estudios recientes indican que las personas quienes consumen todos de sus calorias durante 12 horas se bajan de pesocon Mas eficiencia.  Por ejemplo, si Usted come su primera comida a las 7:00 a.m., su comida final del dia se debe completar antes de las 7:00 p.m.  Aircast arm splint bilaterally throughout day to immobilize tendon at elbow.  Elevate legs and recline as often as can  No more than 2000 mg of sodium daily (read the labels)  No added salt--use herbal flavoring instead.  Get into a pool and by physically active.

## 2018-06-09 LAB — COMPREHENSIVE METABOLIC PANEL
A/G RATIO: 1.6 (ref 1.2–2.2)
ALK PHOS: 72 IU/L (ref 39–117)
ALT: 85 IU/L — ABNORMAL HIGH (ref 0–32)
AST: 60 IU/L — ABNORMAL HIGH (ref 0–40)
Albumin: 4.6 g/dL (ref 3.8–4.8)
BILIRUBIN TOTAL: 0.7 mg/dL (ref 0.0–1.2)
BUN/Creatinine Ratio: 18 (ref 9–23)
BUN: 8 mg/dL (ref 6–24)
CHLORIDE: 101 mmol/L (ref 96–106)
CO2: 26 mmol/L (ref 20–29)
Calcium: 9.4 mg/dL (ref 8.7–10.2)
Creatinine, Ser: 0.44 mg/dL — ABNORMAL LOW (ref 0.57–1.00)
GFR calc Af Amer: 141 mL/min/{1.73_m2} (ref >59)
GFR calc non Af Amer: 122 mL/min/{1.73_m2} (ref >59)
GLOBULIN, TOTAL: 2.9 g/dL (ref 1.5–4.5)
Glucose: 103 mg/dL — ABNORMAL HIGH (ref 65–99)
POTASSIUM: 4.5 mmol/L (ref 3.5–5.2)
SODIUM: 142 mmol/L (ref 134–144)
Total Protein: 7.5 g/dL (ref 6.0–8.5)

## 2018-06-09 LAB — CBC WITH DIFFERENTIAL/PLATELET
Basophils Absolute: 0 10*3/uL (ref 0.0–0.2)
Basos: 1 %
EOS (ABSOLUTE): 0.1 10*3/uL (ref 0.0–0.4)
Eos: 3 %
HEMATOCRIT: 37.1 % (ref 34.0–46.6)
Hemoglobin: 12.1 g/dL (ref 11.1–15.9)
Immature Grans (Abs): 0 10*3/uL (ref 0.0–0.1)
Immature Granulocytes: 1 %
LYMPHS ABS: 1.9 10*3/uL (ref 0.7–3.1)
LYMPHS: 37 %
MCH: 27.3 pg (ref 26.6–33.0)
MCHC: 32.6 g/dL (ref 31.5–35.7)
MCV: 84 fL (ref 79–97)
MONOCYTES: 8 %
Monocytes Absolute: 0.4 10*3/uL (ref 0.1–0.9)
NEUTROS ABS: 2.6 10*3/uL (ref 1.4–7.0)
Neutrophils: 50 %
PLATELETS: 216 10*3/uL (ref 150–450)
RBC: 4.44 x10E6/uL (ref 3.77–5.28)
RDW: 14.1 % (ref 11.7–15.4)
WBC: 5.1 10*3/uL (ref 3.4–10.8)

## 2018-06-09 LAB — LIPID PANEL W/O CHOL/HDL RATIO
CHOLESTEROL TOTAL: 166 mg/dL (ref 100–199)
HDL: 40 mg/dL (ref >39)
LDL Calculated: 94 mg/dL (ref 0–99)
TRIGLYCERIDES: 159 mg/dL — AB (ref 0–149)
VLDL Cholesterol Cal: 32 mg/dL (ref 5–40)

## 2018-07-11 ENCOUNTER — Encounter: Payer: Self-pay | Admitting: Internal Medicine

## 2018-07-11 ENCOUNTER — Ambulatory Visit (INDEPENDENT_AMBULATORY_CARE_PROVIDER_SITE_OTHER): Payer: Medicaid Other | Admitting: Internal Medicine

## 2018-07-11 VITALS — BP 134/80 | HR 72 | Resp 12 | Ht 63.0 in | Wt 249.0 lb

## 2018-07-11 DIAGNOSIS — I10 Essential (primary) hypertension: Secondary | ICD-10-CM

## 2018-07-11 DIAGNOSIS — R748 Abnormal levels of other serum enzymes: Secondary | ICD-10-CM

## 2018-07-11 DIAGNOSIS — G8929 Other chronic pain: Secondary | ICD-10-CM

## 2018-07-11 DIAGNOSIS — Z1239 Encounter for other screening for malignant neoplasm of breast: Secondary | ICD-10-CM

## 2018-07-11 DIAGNOSIS — M25522 Pain in left elbow: Secondary | ICD-10-CM

## 2018-07-11 DIAGNOSIS — R7303 Prediabetes: Secondary | ICD-10-CM

## 2018-07-11 DIAGNOSIS — M25521 Pain in right elbow: Secondary | ICD-10-CM

## 2018-07-11 DIAGNOSIS — M25561 Pain in right knee: Secondary | ICD-10-CM

## 2018-07-11 DIAGNOSIS — R739 Hyperglycemia, unspecified: Secondary | ICD-10-CM

## 2018-07-11 DIAGNOSIS — M25562 Pain in left knee: Secondary | ICD-10-CM

## 2018-07-11 NOTE — Patient Instructions (Signed)

## 2018-07-11 NOTE — Progress Notes (Signed)
    Subjective:    Patient ID: Christina Weiss, female   DOB: 02/11/1971, 48 y.o.   MRN: [id number]   HPI   1.  Obesity:  She has been able to implement changes with her diet.  Eating less now.   Eating only 3 meals daily Drinking half of a soda once weekly, down from 3-4 times weekly Going jogging and taking Zumba Lunges and deep knee bends can hurt, mainly the right knee.  Her pain is superior and medial to the actual knee joint.  2.  Bilateral medial epicondylitis:  Did not obtain velcro arm splint strap as recommended at last visit.  3.  Bilateral knee pain:  No improvement with Meloxicam.  I was unable to find any knee films later than 2014 in her chart.  Some patellar spurring on left.  Right with mild degenerative changes.  Current Meds  Medication Sig  . amLODipine (NORVASC) 10 MG tablet Take 1 tablet (10 mg total) by mouth daily.  . meloxicam (MOBIC) 7.5 MG tablet 1-2 tab by mouth once daily.   No Known Allergies   Review of Systems    Objective:   BP 134/80 (BP Location: Left Arm, Patient Position: Sitting, Cuff Size: Large)   Pulse 72   Resp 12   Ht 5\' 3"  (1.6 m)   Wt 249 lb (112.9 kg)   LMP 07/07/2018   Breastfeeding No   BMI 44.11 kg/m   Physical Exam  NAD Lungs:  CTA CV:  RRR without murmur or rub.  Radial pulses normal and equal. LE:  Hypertropic changes to knees. Elbows:  Not as tender at lateral epicondyles.    Assessment & Plan   1.  Obesity:  Showing some improvement with weight loss.  Encouraged her to continue her work with diet and physical activity.  2.  Knee pain:  To consider avoiding the lunge exercises or other exercises that exacerbate her knee pain.   Consider trying Tylenol twice daily for pain control  3.  Hyperglycemia/prediabetes:  A1C  4.  HM:  mammogram 4.  Elevated liver enzymes:  Hepatic profile and Hep B and C serologies.

## 2018-07-12 LAB — HEPATITIS C ANTIBODY: Hep C Virus Ab: <0.1 s/co ratio (ref 0.0–0.9)

## 2018-07-12 LAB — HEPATITIS B SURFACE ANTIBODY,QUALITATIVE: HEP B SURFACE AB, QUAL: NONREACTIVE

## 2018-07-12 LAB — HEPATIC FUNCTION PANEL
ALBUMIN: 4.5 g/dL (ref 3.8–4.8)
ALK PHOS: 72 IU/L (ref 39–117)
ALT: 73 IU/L — ABNORMAL HIGH (ref 0–32)
AST: 64 IU/L — ABNORMAL HIGH (ref 0–40)
BILIRUBIN, DIRECT: 0.14 mg/dL (ref 0.00–0.40)
Bilirubin Total: 0.5 mg/dL (ref 0.0–1.2)
TOTAL PROTEIN: 7.3 g/dL (ref 6.0–8.5)

## 2018-07-12 LAB — HEPATITIS B SURFACE ANTIGEN: HEP B S AG: NEGATIVE

## 2018-07-12 LAB — HEPATITIS B CORE ANTIBODY, TOTAL: Hep B Core Total Ab: NEGATIVE

## 2018-07-12 LAB — HGB A1C W/O EAG: HEMOGLOBIN A1C: 6.4 % — AB (ref 4.8–5.6)

## 2018-10-24 ENCOUNTER — Other Ambulatory Visit: Payer: Self-pay

## 2018-10-24 ENCOUNTER — Ambulatory Visit
Admission: RE | Admit: 2018-10-24 | Discharge: 2018-10-24 | Disposition: A | Discharge status: Home / Self Care | Payer: No Typology Code available for payment source | Source: Ambulatory Visit | Attending: Internal Medicine | Admitting: Internal Medicine

## 2018-10-24 DIAGNOSIS — Z1239 Encounter for other screening for malignant neoplasm of breast: Secondary | ICD-10-CM

## 2020-02-28 ENCOUNTER — Other Ambulatory Visit: Payer: Self-pay | Admitting: Obstetrics and Gynecology

## 2020-02-28 DIAGNOSIS — Z1231 Encounter for screening mammogram for malignant neoplasm of breast: Secondary | ICD-10-CM

## 2020-05-09 ENCOUNTER — Ambulatory Visit: Payer: No Typology Code available for payment source

## 2022-02-09 ENCOUNTER — Ambulatory Visit (INDEPENDENT_AMBULATORY_CARE_PROVIDER_SITE_OTHER): Payer: Medicaid Other | Admitting: Nurse Practitioner

## 2022-02-09 ENCOUNTER — Encounter: Payer: Self-pay | Admitting: Nurse Practitioner

## 2022-02-09 VITALS — BP 120/80 | HR 76 | Ht 63.0 in | Wt 250.6 lb

## 2022-02-09 DIAGNOSIS — R0683 Snoring: Secondary | ICD-10-CM | POA: Insufficient documentation

## 2022-02-09 DIAGNOSIS — G4719 Other hypersomnia: Secondary | ICD-10-CM | POA: Diagnosis not present

## 2022-02-09 NOTE — Assessment & Plan Note (Signed)
See above plan. 

## 2022-02-09 NOTE — Assessment & Plan Note (Signed)
She has snoring, excessive daytime sleepiness, gasping at night, morning headaches, morning dry mouth. BMI 44. Epworth 9. History of HTN and DM. Given this,  I am concerned she could have sleep disordered breathing with obstructive sleep apnea. She will need sleep study for further evaluation.    - discussed how weight can impact sleep and risk for sleep disordered breathing - discussed options to assist with weight loss: combination of diet modification, cardiovascular and strength training exercises   - had an extensive discussion regarding the adverse health consequences related to untreated sleep disordered breathing - specifically discussed the risks for hypertension, coronary artery disease, cardiac dysrhythmias, cerebrovascular disease, and diabetes - lifestyle modification discussed   - discussed how sleep disruption can increase risk of accidents, particularly when driving - safe driving practices were discussed  Patient Instructions  Given your symptoms, I am concerned that you have sleep disordered breathing with obstructive sleep apnea. I have ordered a sleep study for further evaluation. Someone will contact you for scheduling.   We discussed how untreated sleep apnea puts an individual at risk for cardiac arrhthymias, pulm HTN, DM, stroke and increases their risk for daytime accidents. We also briefly reviewed treatment options including weight loss, side sleeping position, oral appliance, CPAP therapy or referral to ENT for possible surgical options  Follow up in 8-10 weeks with Katie Tayler Heiden,NP or sooner if needed

## 2022-02-09 NOTE — Patient Instructions (Addendum)
Given your symptoms, I am concerned that you have sleep disordered breathing with obstructive sleep apnea. I have ordered a sleep study for further evaluation. Someone will contact you for scheduling.   We discussed how untreated sleep apnea puts an individual at risk for cardiac arrhthymias, pulm HTN, DM, stroke and increases their risk for daytime accidents. We also briefly reviewed treatment options including weight loss, side sleeping position, oral appliance, CPAP therapy or referral to ENT for possible surgical options  Follow up in 8-10 weeks with Christina Raziah Funnell,NP or sooner if needed

## 2022-02-09 NOTE — Progress Notes (Signed)
Reviewed and agree with assessment/plan.   Chesley Mires, MD [facility] Pulmonary/Critical Care 02/09/2022, 2:32 PM Pager:  [phone]

## 2022-02-09 NOTE — Progress Notes (Signed)
@Patient  ID: , female    DOB: [birth year], 51 y.o.   MRN: [id number]  Chief Complaint  Patient presents with   Consult    Sleep apnea, snoring    Referring provider: [id number]  HPI: 51 year old female, never smoker referred for sleep consult. Past medical history significant for HTN, obesity and diabetes.   TEST/EVENTS:   02/09/2022: Today - sleep consult Patient presents today with interpretor and daughter for sleep consult referred by 04/11/2022, PA. She has been struggling with her sleep for many years now. Her family tells her that she snores loudly at night and she also will wake up gasping for air at times. She feels tired in the morning; has restless sleep. She has morning headaches and morning dry mouth. Denies sleep parasomnias/paralysis or symptoms of narcolepsy/cataplexy. She does not drive.  She goes to bed around 1 AM.  Falls asleep within 30 to 40 minutes.  Currently takes melatonin to help her fall/stay asleep.  Wakes frequently throughout the night, up to 3 times a day.  Officially stop bed in the morning around 7 AM.  Her weight has been up over the past 2 years.  She has never had a sleep study before. She has a history of high blood pressure and diabetes, controlled on medication therapy.  No history of stroke.  No significant past family history. Lives at home with her husband and 2 daughters.  She is a never smoker and does not drink any alcohol.  She is a stay-at-home mom.  Epworth 9  No Known Allergies  Immunization History  Administered Date(s) Administered   Influenza Inj Mdck Quad Pf 02/06/2021   Influenza,inj,Quad PF,6+ Mos 02/11/2017, 03/07/2020, 02/03/2022   Influenza-Unspecified 04/24/2003   PFIZER(Purple Top)SARS-COV-2 Vaccination 08/14/2019, 09/04/2019, 03/21/2020   PNEUMOCOCCAL CONJUGATE-20 02/06/2021   Tdap 04/01/2017    Past Medical History:  Diagnosis Date   Cholecystitis, acute with cholelithiasis 06/21/2012    Hypertension    Varicose veins of both lower extremities     Tobacco History: Social History   Tobacco Use  Smoking Status Never  Smokeless Tobacco Never   Counseling given: Not Answered   Outpatient Medications Prior to Visit  Medication Sig Dispense Refill   amLODipine (NORVASC) 10 MG tablet Take 1 tablet (10 mg total) by mouth daily. 30 tablet 11   ibuprofen (ADVIL,MOTRIN) 600 MG tablet Take 1 tablet (600 mg total) by mouth every 6 (six) hours. (Patient not taking: Reported on 07/11/2018) 30 tablet 0   meloxicam (MOBIC) 7.5 MG tablet 1-2 tab by mouth once daily. 30 tablet 4   No facility-administered medications prior to visit.     Review of Systems:   Constitutional: No weight loss or gain, night sweats, fevers, chills, or lassitude. +excessive daytime fatigue  HEENT: No difficulty swallowing, tooth/dental problems, or sore throat. No sneezing, itching, ear ache, nasal congestion, or post nasal drip. +morning headaches and dry mouth CV:  No chest pain, orthopnea, PND, swelling in lower extremities, anasarca, dizziness, palpitations, syncope Resp: +loud snoring; gasping at night. No shortness of breath with exertion or at rest. No excess mucus or change in color of mucus. No productive or non-productive. No hemoptysis. No wheezing.  No chest wall deformity GI:  No heartburn, indigestion, abdominal pain, nausea, vomiting, diarrhea, change in bowel habits, loss of appetite, bloody stools.  GU: No dysuria, change in color of urine, urgency or frequency.  No flank pain, no hematuria  Skin: No rash,  lesions, ulcerations MSK:  No joint pain or swelling.  No decreased range of motion.  No back pain. Neuro: No dizziness or lightheadedness.  Psych: No depression or anxiety. Mood stable. +sleep disturbance    Physical Exam:  BP 120/80 (BP Location: Right Arm)   Pulse 76   Ht 5\' 3"  (1.6 m)   Wt 250 lb 9.6 oz (113.7 kg)   SpO2 96%   BMI 44.39 kg/m   GEN: Pleasant, interactive,  well-appearing; morbidly obese; in no acute distress. HEENT:  Normocephalic and atraumatic. PERRLA. Sclera white. Nasal turbinates pink, moist and patent bilaterally. No rhinorrhea present. Oropharynx pink and moist, without exudate or edema. No lesions, ulcerations, or postnasal drip. Mallampati III NECK:  Supple w/ fair ROM. No JVD present. Normal carotid impulses w/o bruits. Thyroid symmetrical with no goiter or nodules palpated. No lymphadenopathy.   CV: RRR, no m/r/g, no peripheral edema. Pulses intact, +2 bilaterally. No cyanosis, pallor or clubbing. PULMONARY:  Unlabored, regular breathing. Clear bilaterally A&P w/o wheezes/rales/rhonchi. No accessory muscle use.  GI: BS present and normoactive. Soft, non-tender to palpation. No organomegaly or masses detected.  MSK: No erythema, warmth or tenderness. Cap refil <2 sec all extrem. No deformities or joint swelling noted.  Neuro: A/Ox3. No focal deficits noted.   Skin: Warm, no lesions or rashe Psych: Normal affect and behavior. Judgement and thought content appropriate.     Lab Results:  CBC    Component Value Date/Time   WBC 5.1 06/08/2018 1133   WBC 11.1 (H) 05/30/2017 0649   RBC 4.44 06/08/2018 1133   RBC 3.93 05/30/2017 0649   HGB 12.1 06/08/2018 1133   HCT 37.1 06/08/2018 1133   PLT 216 06/08/2018 1133   MCV 84 06/08/2018 1133   MCH 27.3 06/08/2018 1133   MCH 28.2 05/30/2017 0649   MCHC 32.6 06/08/2018 1133   MCHC 33.6 05/30/2017 0649   RDW 14.1 06/08/2018 1133   LYMPHSABS 1.9 06/08/2018 1133   MONOABS 0.3 05/29/2017 1518   EOSABS 0.1 06/08/2018 1133   BASOSABS 0.0 06/08/2018 1133    BMET    Component Value Date/Time   NA 142 06/08/2018 1133   K 4.5 06/08/2018 1133   CL 101 06/08/2018 1133   CO2 26 06/08/2018 1133   GLUCOSE 103 (H) 06/08/2018 1133   GLUCOSE 111 (H) 12/18/2015 0530   BUN 8 06/08/2018 1133   CREATININE 0.44 (L) 06/08/2018 1133   CALCIUM 9.4 06/08/2018 1133   GFRNONAA 122 06/08/2018 1133    GFRAA 141 06/08/2018 1133    BNP No results found for: "BNP"   Imaging:  No results found.        No data to display          No results found for: "NITRICOXIDE"      Assessment & Plan:   Excessive daytime sleepiness She has snoring, excessive daytime sleepiness, gasping at night, morning headaches, morning dry mouth. BMI 44. Epworth 9. History of HTN and DM. Given this,  I am concerned she could have sleep disordered breathing with obstructive sleep apnea. She will need sleep study for further evaluation.    - discussed how weight can impact sleep and risk for sleep disordered breathing - discussed options to assist with weight loss: combination of diet modification, cardiovascular and strength training exercises   - had an extensive discussion regarding the adverse health consequences related to untreated sleep disordered breathing - specifically discussed the risks for hypertension, coronary artery disease, cardiac dysrhythmias, cerebrovascular disease,  and diabetes - lifestyle modification discussed   - discussed how sleep disruption can increase risk of accidents, particularly when driving - safe driving practices were discussed  Patient Instructions  Given your symptoms, I am concerned that you have sleep disordered breathing with obstructive sleep apnea. I have ordered a sleep study for further evaluation. Someone will contact you for scheduling.   We discussed how untreated sleep apnea puts an individual at risk for cardiac arrhthymias, pulm HTN, DM, stroke and increases their risk for daytime accidents. We also briefly reviewed treatment options including weight loss, side sleeping position, oral appliance, CPAP therapy or referral to ENT for possible surgical options  Follow up in 8-10 weeks with Katie Chynna Buerkle,NP or sooner if needed     Loud snoring See above plan.  Morbid obesity (HCC) Healthy weight loss encouraged.    I spent 38 minutes of dedicated  to the care of this patient on the date of this encounter to include pre-visit review of records, face-to-face time with the patient discussing conditions above, post visit ordering of testing, clinical documentation with the electronic health record, making appropriate referrals as documented, and communicating necessary findings to members of the patients care team.  Noemi Chapel, NP 02/09/2022  Pt aware and understands NP's role.

## 2022-02-09 NOTE — Assessment & Plan Note (Signed)
Healthy weight loss encouraged 

## 2022-05-20 ENCOUNTER — Ambulatory Visit: Payer: Medicaid Other | Admitting: Nurse Practitioner

## 2022-05-20 DIAGNOSIS — G4733 Obstructive sleep apnea (adult) (pediatric): Secondary | ICD-10-CM

## 2022-05-20 DIAGNOSIS — G4719 Other hypersomnia: Secondary | ICD-10-CM

## 2022-05-22 DIAGNOSIS — G4733 Obstructive sleep apnea (adult) (pediatric): Secondary | ICD-10-CM | POA: Diagnosis not present

## 2022-05-22 NOTE — Progress Notes (Signed)
Moderate OSA with AHI 24.7/h. Please schedule appt to discuss treatment options. Thanks

## 2022-06-10 ENCOUNTER — Ambulatory Visit (INDEPENDENT_AMBULATORY_CARE_PROVIDER_SITE_OTHER): Payer: Medicaid Other | Admitting: Nurse Practitioner

## 2022-06-10 ENCOUNTER — Encounter: Payer: Self-pay | Admitting: Nurse Practitioner

## 2022-06-10 VITALS — BP 132/82 | HR 74 | Ht 64.0 in | Wt 242.6 lb

## 2022-06-10 DIAGNOSIS — G4733 Obstructive sleep apnea (adult) (pediatric): Secondary | ICD-10-CM | POA: Diagnosis not present

## 2022-06-10 NOTE — Progress Notes (Signed)
@Patient  ID: Marcello Moores, female    DOB: December 30, 1970, 52 y.o.   MRN: [id number]  No chief complaint on file.   Referring provider: [facility]  HPI: 52 year old female, never smoker followed for moderate OSA. Past medical history significant for HTN, obesity and diabetes.   TEST/EVENTS:  05/21/2022 HST: AHI 24.7/h, SpO2 low 76%  02/09/2022: OV with Garima Chronis NP for sleep consult referred by Tereasa Coop, PA. She has been struggling with her sleep for many years now. Her family tells her that she snores loudly at night and she also will wake up gasping for air at times. She feels tired in the morning; has restless sleep. She has morning headaches and morning dry mouth. Denies sleep parasomnias/paralysis or symptoms of narcolepsy/cataplexy. She does not drive.  She goes to bed around 1 AM.  Falls asleep within 30 to 40 minutes.  Currently takes melatonin to help her fall/stay asleep.  Wakes frequently throughout the night, up to 3 times a day.  Officially stop bed in the morning around 7 AM.  Her weight has been up over the past 2 years.  She has never had a sleep study before. She has a history of high blood pressure and diabetes, controlled on medication therapy.  No history of stroke.  No significant past family history. Lives at home with her husband and 2 daughters.  She is a never smoker and does not drink any alcohol.  She is a stay-at-home mom. [place] 9  06/10/2022: Today - follow up Patient presents today for follow up with her daughter/interpretor. She had sleep study completed 05/21/2022 which revealed moderate OSA. She is feeling similar to when she was here last. Poor quality sleep and feeling tired during the day. She is wanting to discuss treatment options. Denies sleep parasomnias/paralysis. She does not drive.   No Known Allergies  Immunization History  Administered Date(s) Administered   Influenza Inj Mdck Quad Pf 02/06/2021   Influenza,inj,Quad PF,6+ Mos 02/11/2017,  03/07/2020, 02/03/2022   Influenza-Unspecified 04/24/2003   PFIZER(Purple Top)SARS-COV-2 Vaccination 08/14/2019, 09/04/2019, 03/21/2020   PNEUMOCOCCAL CONJUGATE-20 02/06/2021   Tdap 04/01/2017    Past Medical History:  Diagnosis Date   Cholecystitis, acute with cholelithiasis 06/21/2012   Hypertension    Varicose veins of both lower extremities     Tobacco History: Social History   Tobacco Use  Smoking Status Never  Smokeless Tobacco Never   Counseling given: Not Answered   Outpatient Medications Prior to Visit  Medication Sig Dispense Refill   amLODipine (NORVASC) 10 MG tablet Take 1 tablet (10 mg total) by mouth daily. 30 tablet 11   ibuprofen (ADVIL,MOTRIN) 600 MG tablet Take 1 tablet (600 mg total) by mouth every 6 (six) hours. 30 tablet 0   meloxicam (MOBIC) 7.5 MG tablet 1-2 tab by mouth once daily. 30 tablet 4   metFORMIN (GLUCOPHAGE) 500 MG tablet Take 500 mg by mouth daily.     OZEMPIC, 0.25 OR 0.5 MG/DOSE, 2 MG/3ML SOPN Inject into the skin.     triamcinolone cream (KENALOG) 0.1 % Apply topically.     No facility-administered medications prior to visit.     Review of Systems:   Constitutional: No weight loss or gain, night sweats, fevers, chills, or lassitude. +excessive daytime fatigue  HEENT: No difficulty swallowing, tooth/dental problems, or sore throat. No sneezing, itching, ear ache, nasal congestion, or post nasal drip. +morning headaches and dry mouth CV:  No chest pain, orthopnea, PND, swelling in lower extremities,  anasarca, dizziness, palpitations, syncope Resp: +loud snoring; gasping at night. No shortness of breath with exertion or at rest. No excess mucus or change in color of mucus. No productive or non-productive. No hemoptysis. No wheezing.  No chest wall deformity GI:  No heartburn, indigestion, abdominal pain, nausea, vomiting, diarrhea, change in bowel habits, loss of appetite, bloody stools.  GU: No dysuria, change in color of urine, urgency  or frequency.  No flank pain, no hematuria  Skin: No rash, lesions, ulcerations MSK:  No joint pain or swelling.  No decreased range of motion.  No back pain. Neuro: No dizziness or lightheadedness.  Psych: No depression or anxiety. Mood stable. +sleep disturbance    Physical Exam:  BP 132/82   Pulse 74   Ht 5\' 4"  (1.626 m)   Wt 242 lb 9.6 oz (110 kg)   SpO2 97%   BMI 41.64 kg/m   GEN: Pleasant, interactive, well-appearing; morbidly obese; in no acute distress. HEENT:  Normocephalic and atraumatic. PERRLA. Sclera white. Nasal turbinates pink, moist and patent bilaterally. No rhinorrhea present. Oropharynx pink and moist, without exudate or edema. No lesions, ulcerations, or postnasal drip. Mallampati III NECK:  Supple w/ fair ROM. No JVD present. Normal carotid impulses w/o bruits. Thyroid symmetrical with no goiter or nodules palpated. No lymphadenopathy.   CV: RRR, no m/r/g, no peripheral edema. Pulses intact, +2 bilaterally. No cyanosis, pallor or clubbing. PULMONARY:  Unlabored, regular breathing. Clear bilaterally A&P w/o wheezes/rales/rhonchi. No accessory muscle use.  GI: BS present and normoactive. Soft, non-tender to palpation. No organomegaly or masses detected.  MSK: No erythema, warmth or tenderness. Cap refil <2 sec all extrem. No deformities or joint swelling noted.  Neuro: A/Ox3. No focal deficits noted.   Skin: Warm, no lesions or rashe Psych: Normal affect and behavior. Judgement and thought content appropriate.     Lab Results:  CBC    Component Value Date/Time   WBC 5.1 06/08/2018 1133   WBC 11.1 (H) 05/30/2017 0649   RBC 4.44 06/08/2018 1133   RBC 3.93 05/30/2017 0649   HGB 12.1 06/08/2018 1133   HCT 37.1 06/08/2018 1133   PLT 216 06/08/2018 1133   MCV 84 06/08/2018 1133   MCH 27.3 06/08/2018 1133   MCH 28.2 05/30/2017 0649   MCHC 32.6 06/08/2018 1133   MCHC 33.6 05/30/2017 0649   RDW 14.1 06/08/2018 1133   LYMPHSABS 1.9 06/08/2018 1133   MONOABS  0.3 05/29/2017 1518   EOSABS 0.1 06/08/2018 1133   BASOSABS 0.0 06/08/2018 1133    BMET    Component Value Date/Time   NA 142 06/08/2018 1133   K 4.5 06/08/2018 1133   CL 101 06/08/2018 1133   CO2 26 06/08/2018 1133   GLUCOSE 103 (H) 06/08/2018 1133   GLUCOSE 111 (H) 12/18/2015 0530   BUN 8 06/08/2018 1133   CREATININE 0.44 (L) 06/08/2018 1133   CALCIUM 9.4 06/08/2018 1133   GFRNONAA 122 06/08/2018 1133   GFRAA 141 06/08/2018 1133    BNP No results found for: "BNP"   Imaging:  No results found.        No data to display          No results found for: "NITRICOXIDE"      Assessment & Plan:   Moderate obstructive sleep apnea Moderate OSA with AHI 24.7/h. We discussed how untreated sleep apnea puts an individual at risk for cardiac arrhthymias, pulm HTN, DM, stroke and increases their risk for daytime accidents. We also briefly  reviewed treatment options including weight loss, side sleeping position, oral appliance, CPAP therapy or referral to ENT for possible surgical options. She was agreeable to move forward with CPAP therapy. Discussed benefits/risks of use and reviewed proper maintenance of machine. All questions addressed. Plans to start on auto setting of 5-15 cmH2O with nasal mask of choice and heated humidification.  Patient Instructions  Start CPAP every night, minimum of 4-6 hours a night.  Change equipment every 30 days or as directed by DME. Wash your tubing with warm soap and water daily, hang to dry. Wash humidifier portion weekly.  Be aware of reduced alertness and do not drive or operate heavy machinery if experiencing this or drowsiness.  Exercise encouraged, as tolerated. Notify if persistent daytime sleepiness occurs even with consistent use of CPAP.  We discussed how untreated sleep apnea puts an individual at risk for cardiac arrhthymias, pulm HTN, DM, stroke and increases their risk for daytime accidents.   Follow up in 12 weeks with Dr.  Ander Slade (new pt 30 min slot) or Katie Ameisha Mcclellan,NP, or sooner, if needed    Morbid obesity ([place]) BMI 41.6. Reviewed correlation between OSA and obesity. Healthy weight loss encouraged.     I spent 32 minutes of dedicated to the care of this patient on the date of this encounter to include pre-visit review of records, face-to-face time with the patient discussing conditions above, post visit ordering of testing, clinical documentation with the electronic health record, making appropriate referrals as documented, and communicating necessary findings to members of the patients care team.  Clayton Bibles, NP 06/10/2022  Pt aware and understands NP's role.

## 2022-06-10 NOTE — Assessment & Plan Note (Signed)
Moderate OSA with AHI 24.7/h. We discussed how untreated sleep apnea puts an individual at risk for cardiac arrhthymias, pulm HTN, DM, stroke and increases their risk for daytime accidents. We also briefly reviewed treatment options including weight loss, side sleeping position, oral appliance, CPAP therapy or referral to ENT for possible surgical options. She was agreeable to move forward with CPAP therapy. Discussed benefits/risks of use and reviewed proper maintenance of machine. All questions addressed. Plans to start on auto setting of 5-15 cmH2O with nasal mask of choice and heated humidification.  Patient Instructions  Start CPAP every night, minimum of 4-6 hours a night.  Change equipment every 30 days or as directed by DME. Wash your tubing with warm soap and water daily, hang to dry. Wash humidifier portion weekly.  Be aware of reduced alertness and do not drive or operate heavy machinery if experiencing this or drowsiness.  Exercise encouraged, as tolerated. Notify if persistent daytime sleepiness occurs even with consistent use of CPAP.  We discussed how untreated sleep apnea puts an individual at risk for cardiac arrhthymias, pulm HTN, DM, stroke and increases their risk for daytime accidents.   Follow up in 12 weeks with Dr. Ander Slade (new pt 30 min slot) or Katie Langdon Crosson,NP, or sooner, if needed

## 2022-06-10 NOTE — Patient Instructions (Addendum)
Start CPAP every night, minimum of 4-6 hours a night.  Change equipment every 30 days or as directed by DME. Wash your tubing with warm soap and water daily, hang to dry. Wash humidifier portion weekly.  Be aware of reduced alertness and do not drive or operate heavy machinery if experiencing this or drowsiness.  Exercise encouraged, as tolerated. Notify if persistent daytime sleepiness occurs even with consistent use of CPAP.  We discussed how untreated sleep apnea puts an individual at risk for cardiac arrhthymias, pulm HTN, DM, stroke and increases their risk for daytime accidents.   Follow up in 12 weeks with Dr. Ander Slade (new pt 30 min slot) or Katie Kainat Pizana,NP, or sooner, if needed

## 2022-06-10 NOTE — Assessment & Plan Note (Signed)
BMI 41.6. Reviewed correlation between OSA and obesity. Healthy weight loss encouraged.

## 2022-06-20 ENCOUNTER — Other Ambulatory Visit: Payer: Self-pay

## 2022-06-20 ENCOUNTER — Emergency Department (HOSPITAL_COMMUNITY): Payer: Medicaid Other

## 2022-06-20 ENCOUNTER — Emergency Department (HOSPITAL_COMMUNITY)
Admission: EM | Admit: 2022-06-20 | Discharge: 2022-06-21 | Disposition: A | Discharge status: Home / Self Care | Payer: Medicaid Other | Attending: Emergency Medicine | Admitting: Emergency Medicine

## 2022-06-20 ENCOUNTER — Encounter (HOSPITAL_COMMUNITY): Payer: Self-pay | Admitting: *Deleted

## 2022-06-20 DIAGNOSIS — R531 Weakness: Secondary | ICD-10-CM | POA: Diagnosis not present

## 2022-06-20 DIAGNOSIS — Z79899 Other long term (current) drug therapy: Secondary | ICD-10-CM | POA: Insufficient documentation

## 2022-06-20 DIAGNOSIS — Z1152 Encounter for screening for COVID-19: Secondary | ICD-10-CM | POA: Diagnosis not present

## 2022-06-20 DIAGNOSIS — R209 Unspecified disturbances of skin sensation: Secondary | ICD-10-CM | POA: Insufficient documentation

## 2022-06-20 DIAGNOSIS — R55 Syncope and collapse: Secondary | ICD-10-CM | POA: Diagnosis not present

## 2022-06-20 DIAGNOSIS — R0781 Pleurodynia: Secondary | ICD-10-CM | POA: Insufficient documentation

## 2022-06-20 DIAGNOSIS — R519 Headache, unspecified: Secondary | ICD-10-CM | POA: Insufficient documentation

## 2022-06-20 DIAGNOSIS — E119 Type 2 diabetes mellitus without complications: Secondary | ICD-10-CM | POA: Insufficient documentation

## 2022-06-20 DIAGNOSIS — Z7984 Long term (current) use of oral hypoglycemic drugs: Secondary | ICD-10-CM | POA: Diagnosis not present

## 2022-06-20 DIAGNOSIS — I10 Essential (primary) hypertension: Secondary | ICD-10-CM | POA: Insufficient documentation

## 2022-06-20 LAB — I-STAT BETA HCG BLOOD, ED (MC, WL, AP ONLY): I-stat hCG, quantitative: <5 m[IU]/mL (ref <5)

## 2022-06-20 LAB — CBG MONITORING, ED: Glucose-Capillary: 199 mg/dL — ABNORMAL HIGH (ref 70–99)

## 2022-06-20 MED ORDER — FENTANYL CITRATE PF 50 MCG/ML IJ SOSY
50.0000 ug | PREFILLED_SYRINGE | Freq: Once | INTRAMUSCULAR | Status: AC
  Administered 2022-06-21: 50 ug via INTRAVENOUS
  Filled 2022-06-20: qty 1

## 2022-06-20 MED ORDER — SODIUM CHLORIDE 0.9 % IV SOLN: INTRAVENOUS | Status: DC

## 2022-06-20 MED ORDER — SODIUM CHLORIDE 0.9 % IV BOLUS
1000.0000 mL | Freq: Once | INTRAVENOUS | Status: AC
  Administered 2022-06-21: 1000 mL via INTRAVENOUS

## 2022-06-20 NOTE — ED Triage Notes (Signed)
The pt arrived by gems from home  everyone was watching tv and the pt stopped talking and when they arrived the pt was not answering questions  she speaks spanish  with a litle english  she had alsohol earflier today  the daughter reports that she speaks a little english  but the  daujghter speaks perfect english  the pt is some verbal

## 2022-06-20 NOTE — ED Notes (Signed)
To x-ray

## 2022-06-20 NOTE — ED Provider Notes (Signed)
[facility] Provider Note   CSN: [id number] Arrival date & time: 06/20/22  2243     History {Add pertinent medical, surgical, social history, OB history to HPI:1} Chief complaint-syncope  NDEA MOSKO is a 52 y.o. female.  The history is provided by the patient and a relative. The history is limited by a language barrier. A language interpreter was used Varney Baas [phone]).  History is provided by the patient and husband and daughter. Patient has history of obesity, diabetes, hypertension presents with an episode of altered level of consciousness. Husband reports that he was trying to go to sleep when family started yelling in the other room.  When he arrived the patient was on responsive but was spontaneously breathing.  He reports he blew in her face and patted her back and she woke up.  No seizures reported.  She has never had this before.  No falls or trauma.  Patient reports she now has a headache as well as sharp chest pain.  She reports some pleuritic pain in her chest.  She also reports numbness throughout all 4 extremities.  She reports she feels weak all over her body She reports that she has had chest pain and bilateral arm numbness for several days. No previous history of CAD/CVA.    No recent stressful events. Home Medications Prior to Admission medications   Medication Sig Start Date End Date Taking? Authorizing Provider  amLODipine (NORVASC) 10 MG tablet Take 1 tablet (10 mg total) by mouth daily. 06/08/18   Mack Hook, MD  ibuprofen (ADVIL,MOTRIN) 600 MG tablet Take 1 tablet (600 mg total) by mouth every 6 (six) hours. 05/31/17   De Hollingshead D, MD  meloxicam (MOBIC) 7.5 MG tablet 1-2 tab by mouth once daily. 06/08/18   Mack Hook, MD  metFORMIN (GLUCOPHAGE) 500 MG tablet Take 500 mg by mouth daily. 09/11/21 09/20/22  [provider]  OZEMPIC, 0.25 OR 0.5 MG/DOSE, 2 MG/3ML SOPN Inject  into the skin. 04/21/22   [provider]  triamcinolone cream (KENALOG) 0.1 % Apply topically. 11/11/21 06/09/23  [provider]      Allergies    Patient has no known allergies.    Review of Systems   Review of Systems  Constitutional:  Negative for fever.  Respiratory:  Positive for shortness of breath. Negative for cough.   Cardiovascular:  Positive for chest pain.    Physical Exam Updated Vital Signs BP (!) 145/78 (BP Location: Left Arm)   Pulse 74   Temp 98.2 F (36.8 C)   Resp 18   Ht 1.626 m (5' 4"$ )   Wt 110 kg   SpO2 99%   BMI 41.63 kg/m  Physical Exam CONSTITUTIONAL: Chronically ill-appearing, appears older than stated age HEAD: Normocephalic/atraumatic EYES: EOMI/PERRL, no nystagmus, no ptosis ENMT: Mucous membranes moist NECK: supple no meningeal signs CV: S1/S2 noted, no murmurs/rubs/gallops noted LUNGS: Lungs are clear to auscultation bilaterally, no apparent distress ABDOMEN: soft, nontender NEURO: Patient is slow to respond but is answering questions appropriately.  She is awake/alert face symmetric She is slow to move her arms, starts shaking both of them when she moves them, but they are equal and no arm drift. She has similar movement of her legs.  She is slow to movement, but no focal weakness is noted.  Her legs start shaking when she lifts them. Cranial nerves 3/4/5/6/11/09/08/11/12 tested and intact Patient reports numbness in her extremities EXTREMITIES: pulses normal, full  ROM SKIN: warm, color normal PSYCH: Anxious  ED Results / Procedures / Treatments   Labs (all labs ordered are listed, but only abnormal results are displayed) Labs Reviewed  CBG MONITORING, ED - Abnormal; Notable for the following components:      Result Value   Glucose-Capillary 199 (*)    All other components within normal limits  RESP PANEL BY RT-PCR (RSV, FLU A&B, COVID)  RVPGX2  COMPREHENSIVE METABOLIC PANEL  CBC WITH DIFFERENTIAL/PLATELET   URINALYSIS, ROUTINE W REFLEX MICROSCOPIC  RAPID URINE DRUG SCREEN, HOSP PERFORMED  ETHANOL  D-DIMER, QUANTITATIVE  CK  I-STAT BETA HCG BLOOD, ED ([facility], [facility], [facility] ONLY)  TROPONIN I (HIGH SENSITIVITY)    EKG EKG Interpretation  Date/Time:  Saturday June 20 2022 22:56:04 EST Ventricular Rate:  66 PR Interval:  150 QRS Duration: 92 QT Interval:  422 QTC Calculation: 442 R Axis:   70 Text Interpretation: Normal sinus rhythm Nonspecific T wave abnormality Abnormal ECG Confirmed by Ripley Fraise [phone]) on 06/20/2022 11:01:28 PM  Radiology No results found.  Procedures Procedures  {Document cardiac monitor, telemetry assessment procedure when appropriate:1}  Medications Ordered in ED Medications  sodium chloride 0.9 % bolus 1,000 mL (has no administration in time range)    And  0.9 %  sodium chloride infusion (has no administration in time range)  fentaNYL (SUBLIMAZE) injection 50 mcg (has no administration in time range)    ED Course/ Medical Decision Making/ A&P Clinical Course as of 06/20/22 2325  Sat Jun 20, 2022  2321 Glucose-Capillary(!): 199 Mild hyperglycemia [DW]  2324 Patient presents with multiple complaints.  Family reports she was watching TV when she went unresponsive.  Husband reports he started blowing on her face and pat her back.  No CPR was performed.  No seizures reported.  She has never had this before.  Patient reports the past several days she has had chest pain and bilateral arm numbness.  Patient is slow to respond and moving her extremities, but there is no gross focal weakness.  Due to poor historian, unclear clinical scenario, and language barrier, extensive imaging and labs have been ordered [DW]    Clinical Course User Index [DW] Ripley Fraise, MD   {   Click here for ABCD2, HEART and other calculatorsREFRESH Note before signing :1}                          Medical Decision Making Amount and/or Complexity of Data Reviewed Labs: ordered.  Decision-making details documented in ED Course. Radiology: ordered.  Risk Prescription drug management.   This patient presents to the ED for concern of syncope/altered level consciousness, this involves an extensive number of treatment options, and is a complaint that carries with it a high risk of complications and morbidity.  The differential diagnosis includes but is not limited to CVA, intracranial hemorrhage, acute coronary syndrome, renal failure, urinary tract infection, electrolyte disturbance, pneumonia, new onset seizures, drug or alcohol intoxication   Comorbidities that complicate the patient evaluation: Patient's presentation is complicated by their history of diabetes and hypertension  Social Determinants of Health: Patient's  English is a second language   increases the complexity of managing their presentation  Additional history obtained: Additional history obtained from family and spouse Records reviewed Primary Care Documents  Lab Tests: I Ordered, and personally interpreted labs.  The pertinent results include: hyperGlycemia  Imaging Studies ordered: I ordered imaging studies including CT scan head and X-ray chest  I independently visualized and interpreted imaging which showed *** I agree with the radiologist interpretation  Cardiac Monitoring: The patient was maintained on a cardiac monitor.  I personally viewed and interpreted the cardiac monitor which showed an underlying rhythm of:  sinus rhythm  Medicines ordered and prescription drug management: I ordered medication including fentanyl for pain Reevaluation of the patient after these medicines showed that the patient    {resolved/improved/worsened:23923::"improved"}  Test Considered: Patient is low risk / negative by ***, therefore do not feel that *** is indicated.  Critical Interventions:  ***  Consultations Obtained: I requested consultation with the {consultation:26851}, and discussed   findings as well as pertinent plan - they recommend: ***  Reevaluation: After the interventions noted above, I reevaluated the patient and found that they have :{resolved/improved/worsened:23923::"improved"}  Complexity of problems addressed: Patient's presentation is most consistent with  acute presentation with potential threat to life or bodily function  Disposition: After consideration of the diagnostic results and the patient's response to treatment,  I feel that the patent would benefit from {disposition:26850}.     {Document critical care time when appropriate:1} {Document review of labs and clinical decision tools ie heart score, Chads2Vasc2 etc:1}  {Document your independent review of radiology images, and any outside records:1} {Document your discussion with family members, caretakers, and with consultants:1} {Document social determinants of health affecting pt's care:1} {Document your decision making why or why not admission, treatments were needed:1} Final Clinical Impression(s) / ED Diagnoses Final diagnoses:  None    Rx / DC Orders ED Discharge Orders     None

## 2022-06-21 ENCOUNTER — Emergency Department (HOSPITAL_COMMUNITY): Payer: Medicaid Other

## 2022-06-21 DIAGNOSIS — R55 Syncope and collapse: Secondary | ICD-10-CM | POA: Diagnosis not present

## 2022-06-21 LAB — COMPREHENSIVE METABOLIC PANEL
ALT: 63 U/L — ABNORMAL HIGH (ref 0–44)
AST: 53 U/L — ABNORMAL HIGH (ref 15–41)
Albumin: 4.1 g/dL (ref 3.5–5.0)
Alkaline Phosphatase: 62 U/L (ref 38–126)
Anion gap: 10 (ref 5–15)
BUN: 10 mg/dL (ref 6–20)
CO2: 26 mmol/L (ref 22–32)
Calcium: 9.2 mg/dL (ref 8.9–10.3)
Chloride: 101 mmol/L (ref 98–111)
Creatinine, Ser: 0.57 mg/dL (ref 0.44–1.00)
GFR, Estimated: >60 mL/min (ref >60)
Glucose, Bld: 190 mg/dL — ABNORMAL HIGH (ref 70–99)
Potassium: 3.4 mmol/L — ABNORMAL LOW (ref 3.5–5.1)
Sodium: 137 mmol/L (ref 135–145)
Total Bilirubin: 0.7 mg/dL (ref 0.3–1.2)
Total Protein: 7.2 g/dL (ref 6.5–8.1)

## 2022-06-21 LAB — CBC WITH DIFFERENTIAL/PLATELET
Abs Immature Granulocytes: 0.04 10*3/uL (ref 0.00–0.07)
Basophils Absolute: 0 10*3/uL (ref 0.0–0.1)
Basophils Relative: 1 %
Eosinophils Absolute: 0.1 10*3/uL (ref 0.0–0.5)
Eosinophils Relative: 2 %
HCT: 39.1 % (ref 36.0–46.0)
Hemoglobin: 12.8 g/dL (ref 12.0–15.0)
Immature Granulocytes: 1 %
Lymphocytes Relative: 27 %
Lymphs Abs: 1.8 10*3/uL (ref 0.7–4.0)
MCH: 28.7 pg (ref 26.0–34.0)
MCHC: 32.7 g/dL (ref 30.0–36.0)
MCV: 87.7 fL (ref 80.0–100.0)
Monocytes Absolute: 0.4 10*3/uL (ref 0.1–1.0)
Monocytes Relative: 6 %
Neutro Abs: 4.4 10*3/uL (ref 1.7–7.7)
Neutrophils Relative %: 63 %
Platelets: 199 10*3/uL (ref 150–400)
RBC: 4.46 MIL/uL (ref 3.87–5.11)
RDW: 13.2 % (ref 11.5–15.5)
WBC: 6.8 10*3/uL (ref 4.0–10.5)
nRBC: 0 % (ref 0.0–0.2)

## 2022-06-21 LAB — RAPID URINE DRUG SCREEN, HOSP PERFORMED
Amphetamines: NOT DETECTED
Barbiturates: NOT DETECTED
Benzodiazepines: NOT DETECTED
Cocaine: NOT DETECTED
Opiates: NOT DETECTED
Tetrahydrocannabinol: NOT DETECTED

## 2022-06-21 LAB — RESP PANEL BY RT-PCR (RSV, FLU A&B, COVID)  RVPGX2
Influenza A by PCR: NEGATIVE
Influenza B by PCR: NEGATIVE
Resp Syncytial Virus by PCR: NEGATIVE
SARS Coronavirus 2 by RT PCR: NEGATIVE

## 2022-06-21 LAB — URINALYSIS, ROUTINE W REFLEX MICROSCOPIC
Bilirubin Urine: NEGATIVE
Glucose, UA: NEGATIVE mg/dL
Hgb urine dipstick: NEGATIVE
Ketones, ur: NEGATIVE mg/dL
Leukocytes,Ua: NEGATIVE
Nitrite: NEGATIVE
Protein, ur: NEGATIVE mg/dL
Specific Gravity, Urine: 1.02 (ref 1.005–1.030)
pH: 6 (ref 5.0–8.0)

## 2022-06-21 LAB — ETHANOL: Alcohol, Ethyl (B): <10 mg/dL (ref <10)

## 2022-06-21 LAB — TSH: TSH: 2.61 u[IU]/mL (ref 0.350–4.500)

## 2022-06-21 LAB — TROPONIN I (HIGH SENSITIVITY)
Troponin I (High Sensitivity): 4 ng/L (ref <18)
Troponin I (High Sensitivity): 6 ng/L (ref <18)

## 2022-06-21 LAB — VITAMIN B12: Vitamin B-12: 860 pg/mL (ref 180–914)

## 2022-06-21 LAB — D-DIMER, QUANTITATIVE: D-Dimer, Quant: 0.5 ug/mL-FEU (ref 0.00–0.50)

## 2022-06-21 LAB — FOLATE: Folate: 28.5 ng/mL (ref >5.9)

## 2022-06-21 LAB — CK: Total CK: 169 U/L (ref 38–234)

## 2022-06-21 MED ORDER — NEPHRO-VITE RX 1 MG PO TABS: 1.0000 | ORAL_TABLET | Freq: Every day | ORAL | 0 refills | Status: AC

## 2022-06-21 MED ORDER — LORAZEPAM 1 MG PO TABS
0.5000 mg | ORAL_TABLET | ORAL | Status: DC | PRN
  Administered 2022-06-21: 0.5 mg via ORAL
  Filled 2022-06-21: qty 1

## 2022-06-21 NOTE — Consult Note (Signed)
NEUROLOGY CONSULTATION NOTE   Date of service: June 21, 2022 Patient Name: Christina Weiss MRN:  [id number] DOB:  09/23/1970 Reason for consult: "Episode of unable to unresponsiveness, headache and numb all over with chest pain Requesting Provider: Ripley Fraise, MD _ _ _   _ __   _ __ _ _  __ __   _ __   __ _  History of Present Illness  Christina Weiss is a 52 y.o. female with PMH significant for DM2, HTN, OSA, obesity who presents from home with an episode with multiple symptoms including passing out, some chest pain, headache that she describes as bifrontal and in the back of her head, dizziness which she describes as spinning and feeling off balance before she passed out.  She only briefly passed out before regaining consciousness and was back to her baseline.  Husband and family called EMS and she was brought into the hospital.  History was obtained mostly from patient using a video Spanish interpreter.  Patient's son and husband are at the bedside and add to the history obtained.  Patient reports that she was watching TV and then she got up and she experienced some chest pressure, headache that she describes as a throb in the front and in the back of her head.  She reports that she felt spinning and was off balance and then she passed out.  She was immediately back to her baseline.  Family called EMS and she was brought into the hospital.  She has no prior history of similar complaints.  She denies any recent fevers or any signs or symptoms of an upper respiratory infection, UTI, gastroenteritis.  She however reports that she has very dry throat and she urinates a lot and has been having to drink a lot of water and fluids to keep up with hydration.  She denies any recent changes to her medications specifically her blood pressure medications. She eats very little meat, but eats vegetables and dairy.  She reports that she has been diagnosed with obstructive sleep apnea and she is  currently not on CPAP.  She does endorse to waking up in the morning at times tired.  She reports that the CPAP is about to come in the next couple weeks.  She denies any prior history of strokes, no history of seizures, no family history of strokes or seizures.  She does not drink alcohol, does not smoke, does not use any recreational substances at all.  She had workup in the ED with CT head without contrast which demonstrated an indeterminate hypodensity in the right cerebellar hemisphere.  MRI brain without contrast was attempted in the ED but was aborted as that triggered a panic attack and patient was unable to tolerate the MRI.  I discussed with her about repeating MRI with sedation or with anxiolytic and at this time she declines MRI.   ROS   Constitutional Denies weight loss, fever and chills.   HEENT Denies changes in vision and hearing.   Respiratory Denies SOB and cough.   CV Denies palpitations and CP   GI Denies abdominal pain, nausea, vomiting and diarrhea.   GU Denies dysuria and urinary frequency.   MSK Denies myalgia and joint pain.   Skin Denies rash and pruritus.   Neurological Denies headache and syncope.   Psychiatric Denies recent changes in mood. Denies anxiety and depression.    Past History   Past Medical History:  Diagnosis Date   Cholecystitis, acute with cholelithiasis  06/21/2012   Hypertension    Varicose veins of both lower extremities    Past Surgical History:  Procedure Laterality Date   ANKLE SURGERY     CHOLECYSTECTOMY N/A 06/22/2012   Procedure: LAPAROSCOPIC CHOLECYSTECTOMY WITH ATTEMPTED CHOLANGIOGRAM;  Surgeon: Harl Bowie, MD;  Location: [facility] OR;  Service: General;  Laterality: N/A;   DILATION AND CURETTAGE OF UTERUS     ENDOSCOPIC VEIN LASER TREATMENT     Family History  Problem Relation Age of Onset   Hypertension Mother    Varicose Veins Mother    Asthma Neg Hx    Arthritis Neg Hx    Alcohol abuse Neg Hx    Birth defects Neg Hx     Cancer Neg Hx    Depression Neg Hx    COPD Neg Hx    Diabetes Neg Hx    Drug abuse Neg Hx    Early death Neg Hx    Hearing loss Neg Hx    Heart disease Neg Hx    Hyperlipidemia Neg Hx    Kidney disease Neg Hx    Learning disabilities Neg Hx    Mental illness Neg Hx    Mental retardation Neg Hx    Miscarriages / Stillbirths Neg Hx    Vision loss Neg Hx    Stroke Neg Hx    Social History   Socioeconomic History   Marital status: Married    Spouse name: Oncologist   Number of children: 4   Years of education: 4   Highest education level: Not on file  Occupational History   Occupation: Housewife.  Tobacco Use   Smoking status: Never   Smokeless tobacco: Never  Substance and Sexual Activity   Alcohol use: No   Drug use: No   Sexual activity: Yes    Birth control/protection: Condom  Other Topics Concern   Not on file  Social History Narrative   Lives at home with husband and 3 children   Social Determinants of Health   Financial Resource Strain: Not on file  Food Insecurity: No Food Insecurity (06/08/2018)   Hunger Vital Sign    Worried About Running Out of Food in the Last Year: Never true    Ran Out of Food in the Last Year: Never true  Transportation Needs: No Transportation Needs (06/08/2018)   PRAPARE - Hydrologist (Medical): No    Lack of Transportation (Non-Medical): No  Physical Activity: Not on file  Stress: Not on file  Social Connections: Not on file   No Known Allergies  Medications  (Not in a hospital admission)    Vitals   Vitals:   06/20/22 2250 06/20/22 2306 06/21/22 0100 06/21/22 0145  BP: (!) 145/78  (!) 156/86 139/86  Pulse: 74  70 78  Resp: 18  15 17  $ Temp: 98.2 F (36.8 C)     SpO2: 99%  98% 97%  Weight:  110 kg    Height:  5' 4"$  (1.626 m)       Body mass index is 41.63 kg/m.  Physical Exam   General: Laying comfortably in bed; in no acute distress.  HENT: Normal oropharynx and mucosa.  Normal external appearance of ears and nose.  Neck: Supple, no pain or tenderness  CV: No JVD. No peripheral edema.  Pulmonary: Symmetric Chest rise. Normal respiratory effort.  Abdomen: Soft to touch, non-tender.  Ext: No cyanosis, edema, or deformity  Skin: No rash. Normal palpation of skin.  Musculoskeletal: Normal digits and nails by inspection. No clubbing.   Neurologic Examination  Mental status/Cognition: Alert, oriented to self, place, month and year, good attention.  Speech/language: Fluent, comprehension intact, object naming intact, repetition intact.  Cranial nerves:   CN II Pupils equal and reactive to light, no VF deficits    CN III,IV,VI EOM intact, no gaze preference or deviation, no nystagmus    CN V normal sensation in V1, V2, and V3 segments bilaterally    CN VII no asymmetry, no nasolabial fold flattening    CN VIII normal hearing to speech    CN IX & X normal palatal elevation, no uvular deviation    CN XI 5/5 head turn and 5/5 shoulder shrug bilaterally    CN XII midline tongue protrusion    Motor:  Muscle bulk: normal, tone normal, pronator drift none tremor none Mvmt Root Nerve  Muscle Right Left Comments  SA C5/6 Ax Deltoid 5 5   EF C5/6 Mc Biceps 5 5   EE C6/7/8 Rad Triceps 5 5   WF C6/7 Med FCR     WE C7/8 PIN ECU     F Ab C8/T1 U ADM/FDI 5 5   HF L1/2/3 Fem Illopsoas 5 5   KE L2/3/4 Fem Quad 5 5   DF L4/5 D Peron Tib Ant 5 5   PF S1/2 Tibial Grc/Sol 5 5    Sensation:  Light touch Intact throughout   Pin prick    Temperature    Vibration   Proprioception    Coordination/Complex Motor:  - Finger to Nose intact bilaterally - Heel to shin intact bilaterally - Rapid alternating movement are normal - Gait: Deferred for patient's safety.  Labs   CBC:  Recent Labs  Lab 06/20/22 2335  WBC 6.8  NEUTROABS 4.4  HGB 12.8  HCT 39.1  MCV 87.7  PLT 123XX123    Basic Metabolic Panel:  Lab Results  Component Value Date   NA 137 06/20/2022   K  3.4 (L) 06/20/2022   CO2 26 06/20/2022   GLUCOSE 190 (H) 06/20/2022   BUN 10 06/20/2022   CREATININE 0.57 06/20/2022   CALCIUM 9.2 06/20/2022   GFRNONAA >60 06/20/2022   GFRAA 141 06/08/2018   Lipid Panel:  Lab Results  Component Value Date   LDLCALC 94 06/08/2018   HgbA1c:  Lab Results  Component Value Date   HGBA1C 6.4 (H) 07/11/2018   Urine Drug Screen:     Component Value Date/Time   LABOPIA NONE DETECTED 06/21/2022 0126   COCAINSCRNUR NONE DETECTED 06/21/2022 0126   LABBENZ NONE DETECTED 06/21/2022 0126   AMPHETMU NONE DETECTED 06/21/2022 0126   THCU NONE DETECTED 06/21/2022 0126   LABBARB NONE DETECTED 06/21/2022 0126    Alcohol Level     Component Value Date/Time   ETH <10 06/20/2022 2335    CT Head without contrast(Personally reviewed): Indeterminate hypodensity in the right cerebellar hemisphere.  MRI Brain: Unable to obtain  Impression   Christina Weiss is a 52 y.o. female with PMH significant for DM2, HTN, OSA, obesity who presents from home with an episode with multiple symptoms including passing out, some chest pain, headache that she describes as bifrontal and in the back of her head, dizziness which she describes as spinning and feeling off balance before she passed out.  She only briefly passed out before regaining consciousness and was back to her baseline.  Husband and family called EMS and she was brought into the hospital.  She has not symptoms at this time and her neuro exam is completely normal.  Differential for this episode if vast including syncopal, orthostatic, acute sleep attack in the setting of OSA, dehydration, cardiac/arhythmia.  She has no symptoms at this time. CT Head with a ?R cerebellar hypodensity which I suspect is likely an old infarct. However, hard to be sure specially since we are unable to get MRI Brain.  Recommendations  - Orthostatic vitals. -TSH, Vit B12, folate. - follow up with neurology and PCP outpatient with  repeat CT Head outpatient to see if it shows any significant changes. - I discussed with patient and husband and recommended that she exercise and uses her CPAP when she gets it. - okay to discharge from a neuro standpoint. ______________________________________________________________________   Thank you for the opportunity to take part in the care of this patient. If you have any further questions, please contact the neurology consultation attending.  Signed,  [place] Pager Number [phone] _ _ _   _ __   _ __ _ _  __ __   _ __   __ _

## 2022-06-21 NOTE — ED Notes (Signed)
Patient taken over to MRI at this time

## 2022-06-21 NOTE — ED Notes (Signed)
Patient brought back to ED room 15 after attempting MRI. Patient unable to tolerate MRI as she is unable to lay flat. Christy Gentles, MD made aware.
